# Patient Record
Sex: Female | Born: 1955 | Race: Asian | Hispanic: No | Marital: Married | Smoking: Never smoker
Health system: Western US, Academic
[De-identification: ages and names within clinical notes are randomized; demographics above are authoritative.]

## PROBLEM LIST (undated history)

## (undated) DIAGNOSIS — Z87440 Personal history of urinary (tract) infections: Secondary | ICD-10-CM

## (undated) DIAGNOSIS — E119 Type 2 diabetes mellitus without complications: Secondary | ICD-10-CM

## (undated) DIAGNOSIS — I10 Essential (primary) hypertension: Secondary | ICD-10-CM

## (undated) DIAGNOSIS — E7800 Pure hypercholesterolemia, unspecified: Secondary | ICD-10-CM

## (undated) HISTORY — DX: Type 2 diabetes mellitus without complications: E11.9

## (undated) HISTORY — DX: Personal history of urinary (tract) infections: Z87.440

## (undated) NOTE — Assessment & Plan Note (Signed)
Associated Problem(s): Dyspepsia  Formatting of this note might be different from the original.  Chronic, intermittent. 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 Advised smaller portions meals, low in CHO. EGD is indicated.    The procedure and prep were discussed with the patient.   Biopsies or removal of tissue will be performed if indicated. All of the patient's questions and concerns were addressed. Patient verbalized understanding and wishes to proceed with examination.     Electronically signed by Rita Ohara, MD at 11/10/2021  9:17 AM PDT

## (undated) NOTE — Progress Notes (Signed)
Formatting of this note is different from the original.  Gastroenterology Consult Note    Impression and Recommendations:   Lori Bowman is a 10 year old female who visits for evaluation of:    Problem List Items Addressed This Visit       Dyspepsia - Primary     Chronic, intermittent. 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 Advised smaller portions meals, low in CHO. EGD is indicated.    The procedure and prep were discussed with the patient.   Biopsies or removal of tissue will be performed if indicated. All of the patient's questions and concerns were addressed. Patient verbalized understanding and wishes to proceed with examination.          Relevant Orders    REFERRAL FOR PROCEDURE/SURGERY AUTH REQUEST     Dear Dr No PCP,    I had the pleasure of seeing our mutual patient, Lori Bowman, in Gastroenterology clinic today. As you are aware, Lori Bowman is a 76 year old female who was referred for evaluation of chronic dyspepsia.    > 1 year of intermittent epigastric discomfort with or without po intake. Her bloating did not change with reducing CHO intake. + nausea, vomiting when symptoms are severe. No weight loss, hematemesis. No asa or nsaids use. No family history of foregut pathology or malignancies.      Medical History         No past medical history on file.         No past surgical history on file.    Current Outpatient Medications   Medication Sig Dispense Refill    amLODIPine (NORVASC) 5mg  Tab Take one Tab by mouth every morning      atorvastatin (LIPITOR) 10mg  Tab Take one Tab by mouth daily      cetirizine (ZYRTEC) 10mg  Tab Take one Tab by mouth daily      lisinopril (PRINIVIL, ZESTRIL) 10mg  Tab Take one Tab by mouth daily      metFORMIN (GLUCOPHAGE) 500mg  Tab Take 1 tablet by mouth once daily with a meal.      metoprolol succinate (KAPSPARGO SPRINKLE) 50mg  Capsule Take one Cap by mouth daily      phentermine (ADIPEX P) 37.5mg  Cap Take one Cap by mouth daily 30 minutes before breakfast       No current  facility-administered medications for this visit.     Review of patient's allergies indicates no known allergies.    FH: family history is not on file.    Social History     Tobacco Use    Smoking status: Never    Smokeless tobacco: Not on file   Vaping Use    Vaping Use: Not on file   Substance Use Topics    Alcohol use: Never    Drug use: Not on file       Physical exam:  BP 107/70 (Site: LA, Position: Sitting)   Pulse 70   Wt 64.8 kg (142 lb 14.4 oz)   BMI There is no height on file.    Gen: NAD  Eyes: Normal conjunctiva, PERRL    Thank you very much for this consultation. Should you have any questions, please feel free to contact me.    Sincerely,    Waynetta Pean, MD  Gastroenterology    Electronically signed by Rita Ohara, MD at 11/10/2021  9:18 AM PDT

---

## 1999-04-11 ENCOUNTER — Other Ambulatory Visit: Admission: RE | Admit: 1999-04-11 | Discharge: 1999-04-11 | Payer: Self-pay | Admitting: Obstetrics and Gynecology

## 2008-06-22 ENCOUNTER — Emergency Department (HOSPITAL_BASED_OUTPATIENT_CLINIC_OR_DEPARTMENT_OTHER): Admission: EM | Admit: 2008-06-22 | Discharge: 2008-06-22 | Payer: Self-pay | Admitting: Emergency Medicine

## 2008-06-22 ENCOUNTER — Ambulatory Visit: Payer: Self-pay | Admitting: Diagnostic Radiology

## 2009-12-03 IMAGING — CT CT HEAD W/O CM
2 series · 16 of 30 positions shown, 18 images · non-contrast
Comparison: None

CLINICAL DATA: Head trauma, struck behind left ear, laceration,
fall

CT HEAD WITHOUT CONTRAST
TECHNIQUE: Contiguous axial images were obtained from the base of
the skull through the vertex without contrast.

[Series 2: head 4.8 h37s · axial · 0.44mm/px · z∈[-130,+6]mm · 8 of 36 slices shown, 10 images]
[im 4/36  brain]
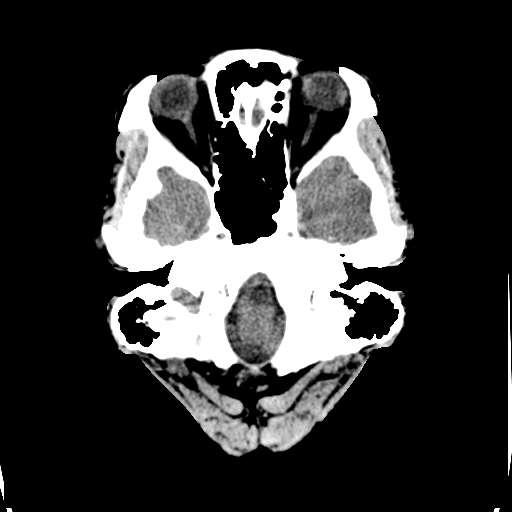
[im 4/36  bone]
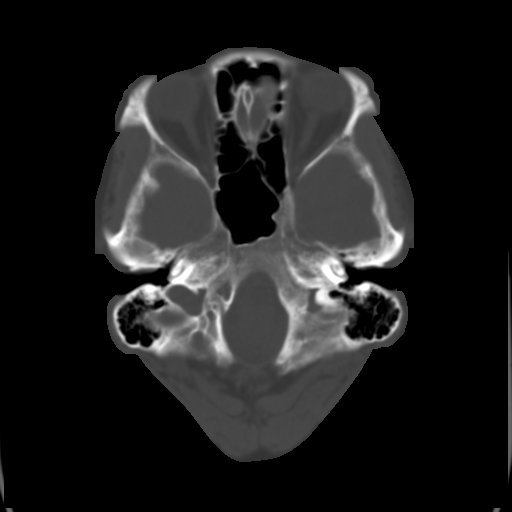
[im 8/36  brain]
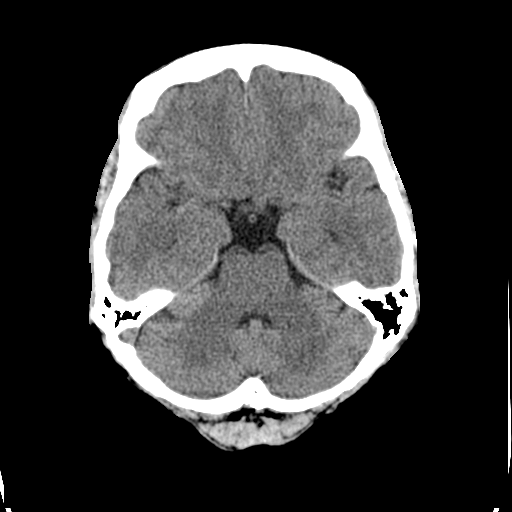
[im 12/36  brain]
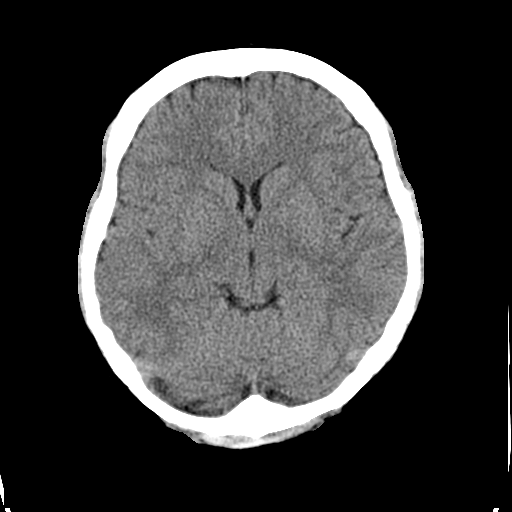
[im 16/36  brain]
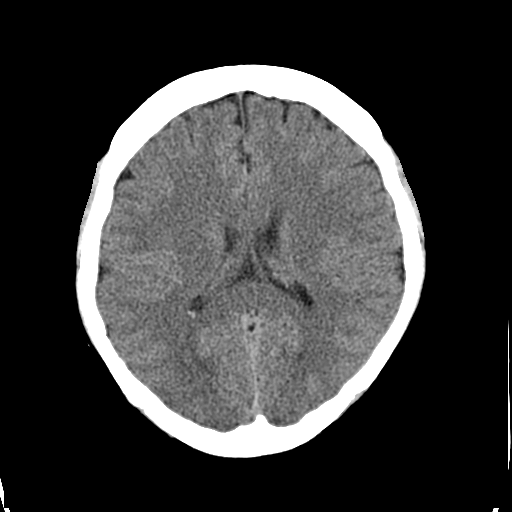
[im 20/36  brain]
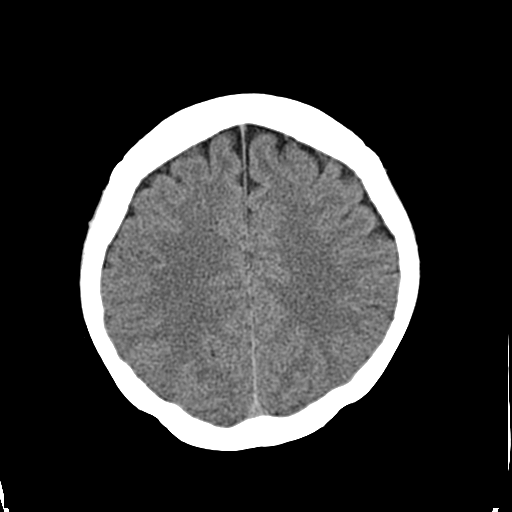
[im 20/36  bone]
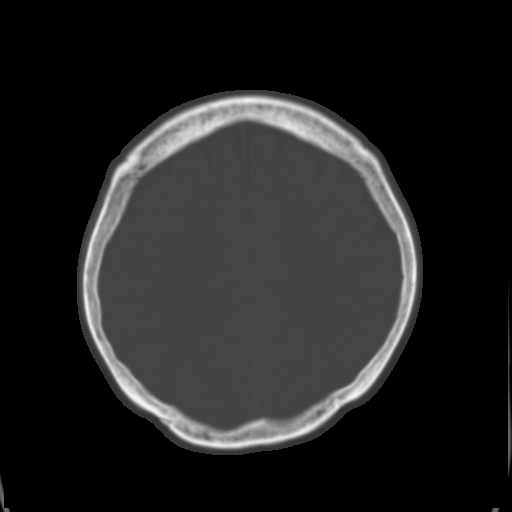
[im 24/36  brain]
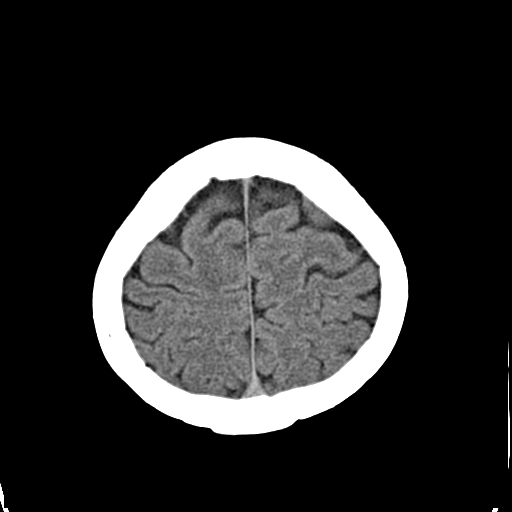
[im 28/36  brain]
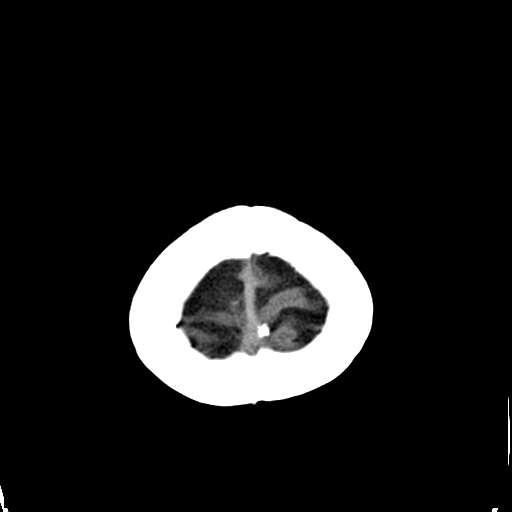
[im 32/36  brain]
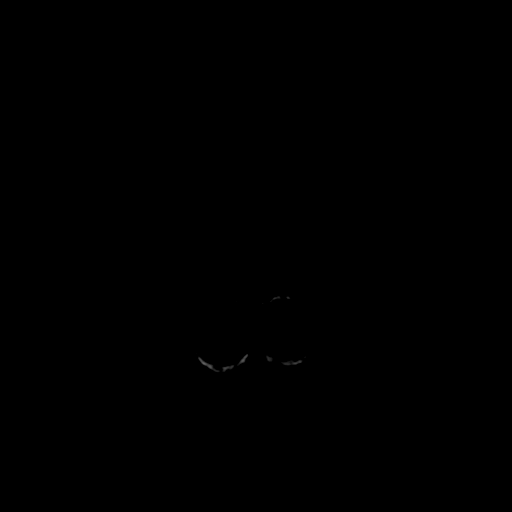

[Series 3: head 2.4 h60s bone · axial · 0.44mm/px · z∈[-129,+8]mm · 8 of 72 slices shown]
[im 8/72  bone]
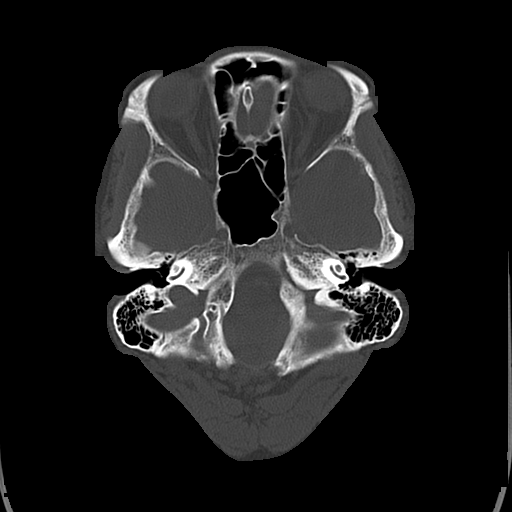
[im 15/72  bone]
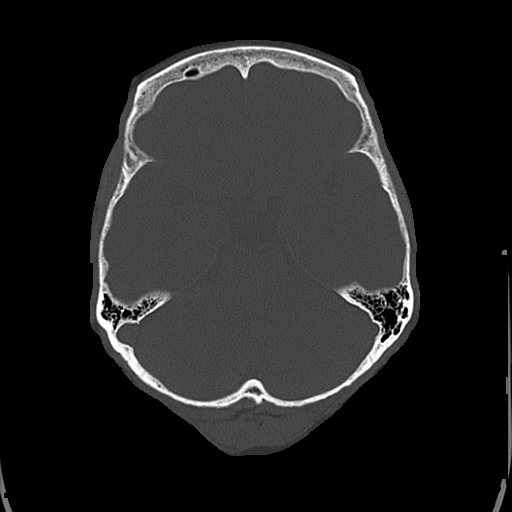
[im 23/72  bone]
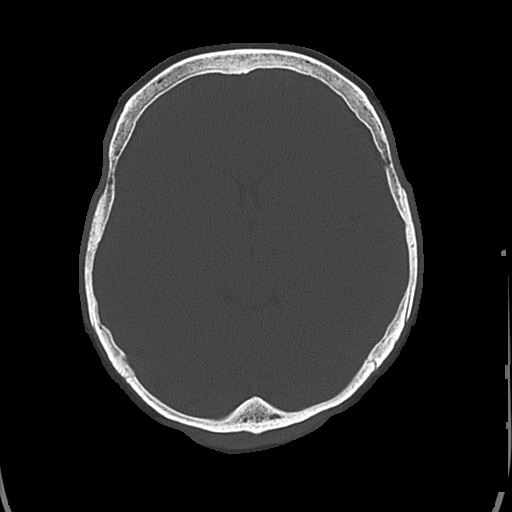
[im 30/72  bone]
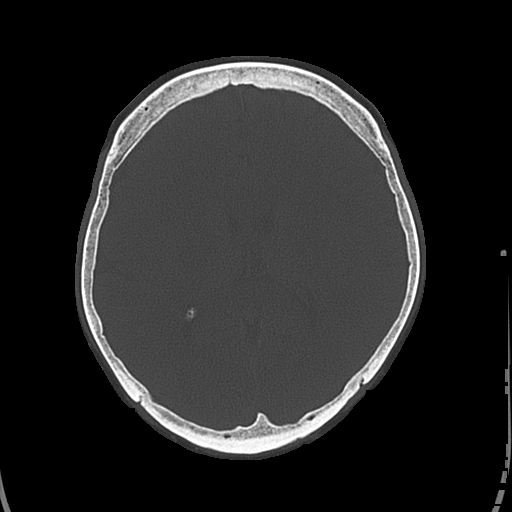
[im 42/72  bone]
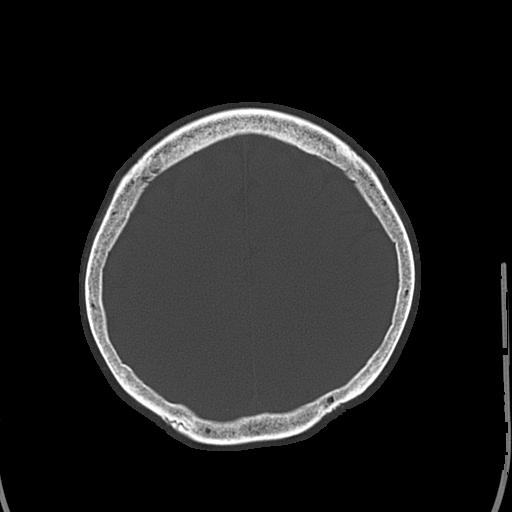
[im 49/72  bone]
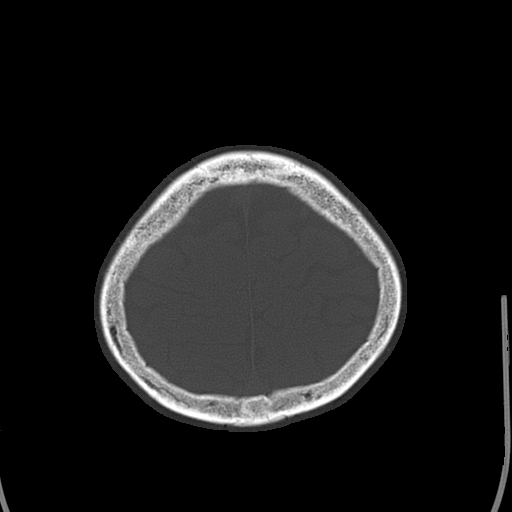
[im 57/72  bone]
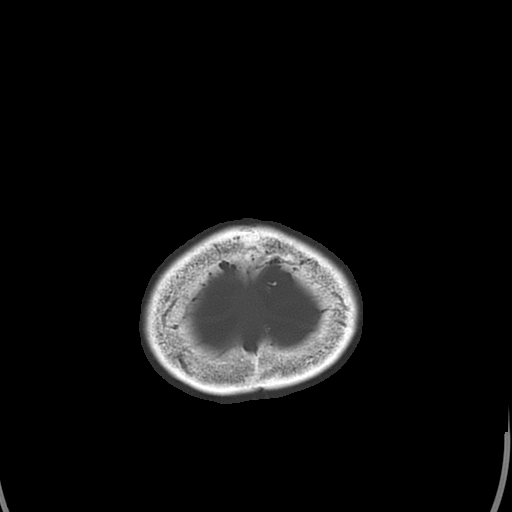
[im 64/72  bone]
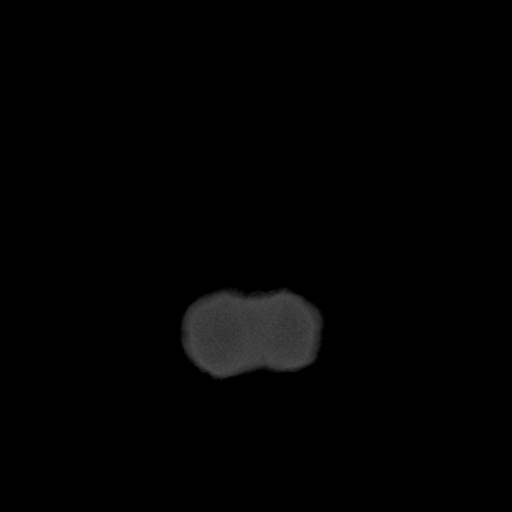

[16 of 30 positions shown; findings below may reference images not displayed]

FINDINGS: Normal ventricular morphology.
No midline shift or mass effect.
Normal appearance of brain parenchyma.
No intracranial hemorrhage, mass lesion, or acute infarction.
Visualized paranasal sinuses and mastoid air cells clear.
Bones unremarkable.
IMPRESSION: No acute intracranial abnormalities.

## 2016-04-03 ENCOUNTER — Other Ambulatory Visit: Payer: Self-pay | Admitting: Podiatry

## 2016-04-03 MED ORDER — OXYCODONE-ACETAMINOPHEN 5-325 MG PO TABS: 1.0000 | ORAL_TABLET | Freq: Four times a day (QID) | ORAL | 0 refills | Status: DC | PRN

## 2016-04-03 MED ORDER — NAPROXEN 500 MG PO TABS: 500.0000 mg | ORAL_TABLET | Freq: Two times a day (BID) | ORAL | 0 refills | Status: AC

## 2016-04-11 ENCOUNTER — Encounter: Payer: Self-pay | Admitting: Podiatry

## 2016-04-11 ENCOUNTER — Ambulatory Visit (INDEPENDENT_AMBULATORY_CARE_PROVIDER_SITE_OTHER): Payer: BLUE CROSS/BLUE SHIELD | Admitting: Podiatry

## 2016-04-11 DIAGNOSIS — S99922A Unspecified injury of left foot, initial encounter: Secondary | ICD-10-CM | POA: Diagnosis not present

## 2016-04-11 DIAGNOSIS — S92309A Fracture of unspecified metatarsal bone(s), unspecified foot, initial encounter for closed fracture: Secondary | ICD-10-CM

## 2016-04-11 NOTE — Patient Instructions (Signed)
Left foot and leg placed in Fiber glass cast. Return in 3 weeks.

## 2016-04-11 NOTE — Progress Notes (Signed)
SUBJECTIVE: 61 y.o. year old female presents for follow up on injured left foot. She fell down on stairs. Foot was too painful to bear any weight. She was seen last week during off office hour for urgent foot pain. The foot was placed in Unaboot to reduce swelling and confirmed of metatarsal base fractures on 2, 3, 4th left foot. She is in to have cast put on.   REVIEW OF SYSTEMS: Pertinent items noted in HPI and remainder of comprehensive ROS otherwise negative.  OBJECTIVE: DERMATOLOGIC EXAMINATION: Forefoot ecchymosis left foot with reduced swelling.   VASCULAR EXAMINATION OF LOWER LIMBS: All pedal pulses are palpable with normal pulsation.  Capillary Filling times within 3 seconds in all digits.  Reduced edema on left foot. Positive of forefoot ecchymosis. Temperature gradient from tibial crest to dorsum of foot is within normal bilateral.  NEUROLOGIC EXAMINATION OF THE LOWER LIMBS: All epicritic and tactile sensations grossly intact.  MUSCULOSKELETAL EXAMINATION: No gross deformities.  RADIOGRAPHIC STUDIES:  AP View:  First intermetatarsal angle: Positive of compound fracture extra articular at the base of 2nd 3rd and 4th left foot without displacement of fracture particles.   ASSESSMENT: 1. Injury left foot. 2. Compound fracture left 2nd, 3rd, 4th metatarsal base extra articular.  PLAN: Reviewed findings and available treatment options. Left foot and leg placed in Fiberglass cast.

## 2016-04-27 ENCOUNTER — Other Ambulatory Visit: Payer: Self-pay | Admitting: Internal Medicine

## 2016-04-27 DIAGNOSIS — R3129 Other microscopic hematuria: Secondary | ICD-10-CM

## 2016-05-02 ENCOUNTER — Ambulatory Visit (INDEPENDENT_AMBULATORY_CARE_PROVIDER_SITE_OTHER): Payer: BLUE CROSS/BLUE SHIELD | Admitting: Podiatry

## 2016-05-02 DIAGNOSIS — S92309A Fracture of unspecified metatarsal bone(s), unspecified foot, initial encounter for closed fracture: Secondary | ICD-10-CM

## 2016-05-02 MED ORDER — OXYCODONE-ACETAMINOPHEN 5-325 MG PO TABS: 1.0000 | ORAL_TABLET | Freq: Four times a day (QID) | ORAL | 0 refills | Status: AC | PRN

## 2016-05-03 ENCOUNTER — Encounter: Payer: Self-pay | Admitting: Podiatry

## 2016-05-03 NOTE — Patient Instructions (Signed)
Positive of bone healing in progress at fracture site left foot.

## 2016-05-03 NOTE — Progress Notes (Signed)
SUBJECTIVE: 61 y.o. year old female presents wearing Fiberglass cast and using Crutches for follow up on injured left foot.   HPI:  4 weeks ago s+he fell down on stairs. Foot was too painful to bear any weight. She was seen last week during off office hour for urgent foot pain. The foot was placed in Unaboot to reduce swelling and confirmed of metatarsal base fractures on 2, 3, 4th left foot. She is in to have cast put on.   REVIEW OF SYSTEMS: Pertinent items noted in HPI and remainder of comprehensive ROS otherwise negative.  OBJECTIVE: DERMATOLOGIC EXAMINATION: Forefoot ecchymosis left foot with reduced swelling.   VASCULAR EXAMINATION OF LOWER LIMBS: All pedal pulses are palpable with normal pulsation.  No edema or erythema noted. Temperature gradient from tibial crest to dorsum of foot is within normal bilateral.  NEUROLOGIC EXAMINATION OF THE LOWER LIMBS: All epicritic and tactile sensations grossly intact.  MUSCULOSKELETAL EXAMINATION: No gross deformities.  RADIOGRAPHIC STUDIES:  AP View:  Positive of healing fracture site with trabeculation crossing and filling fracture lines at the proximal metatarsal shaft of 2nd, 3rd, and 4th left foot..  ASSESSMENT: 1. Injury left foot with compound fracture left 2nd, 3rd, 4th metatarsal base extra articular. 2. Positive sign of bone healing.   PLAN: Reviewed findings. Cast removed and placed in CAM walker.  Return in 4 weeks.

## 2016-05-30 ENCOUNTER — Encounter: Payer: Self-pay | Admitting: Podiatry

## 2016-05-30 ENCOUNTER — Ambulatory Visit (INDEPENDENT_AMBULATORY_CARE_PROVIDER_SITE_OTHER): Payer: BLUE CROSS/BLUE SHIELD | Admitting: Podiatry

## 2016-05-30 DIAGNOSIS — S92309A Fracture of unspecified metatarsal bone(s), unspecified foot, initial encounter for closed fracture: Secondary | ICD-10-CM

## 2016-05-30 NOTE — Progress Notes (Signed)
SUBJECTIVE: 61 y.o.year old femalepresents wearing Fiberglass cast and using Crutches for follow up on injured left foot.  Was able to walk on barefoot at home without pain.  HPI: S/P 8 weeks ago she fell down on stairs.  She was on cast for 4 weeks and been on Cam walker for 4 weeks.  OBJECTIVE: DERMATOLOGIC EXAMINATION: No abnormal color or swelling.  VASCULAR EXAMINATION OF LOWER LIMBS: All pedal pulses are palpable with normal pulsation.  No edema or erythema noted.  NEUROLOGIC EXAMINATION OF THE LOWER LIMBS: All epicritic and tactile sensations grossly intact.  MUSCULOSKELETAL EXAMINATION: Good range of motion without pain on forefoot and rearfoot in affected left lower limb.  PLAN: Reviewed findings. Ok to resume regular activity with regular shoes.

## 2016-05-30 NOTE — Patient Instructions (Signed)
8 weeks following left foot injury. Ok to wear regular shoes. Return as needed.

## 2016-07-17 ENCOUNTER — Other Ambulatory Visit: Payer: Self-pay | Admitting: Podiatry

## 2016-07-17 MED ORDER — SULFAMETHOXAZOLE-TRIMETHOPRIM 800-160 MG PO TABS: 1.0000 | ORAL_TABLET | Freq: Two times a day (BID) | ORAL | 0 refills | Status: AC

## 2016-09-28 ENCOUNTER — Encounter: Payer: Self-pay | Admitting: Podiatry

## 2019-12-21 ENCOUNTER — Emergency Department
Admission: RE | Admit: 2019-12-21 | Discharge: 2019-12-21 | Disposition: A | Discharge status: Home / Self Care | Payer: No Typology Code available for payment source | Attending: Emergency Medicine | Admitting: Emergency Medicine

## 2019-12-21 DIAGNOSIS — I10 Essential (primary) hypertension: Secondary | ICD-10-CM | POA: Insufficient documentation

## 2019-12-21 DIAGNOSIS — R319 Hematuria, unspecified: Secondary | ICD-10-CM | POA: Insufficient documentation

## 2019-12-21 DIAGNOSIS — N390 Urinary tract infection, site not specified: Secondary | ICD-10-CM | POA: Insufficient documentation

## 2019-12-21 HISTORY — DX: Pure hypercholesterolemia, unspecified: E78.00

## 2019-12-21 HISTORY — DX: Essential (primary) hypertension: I10

## 2019-12-21 LAB — URINALYSIS-COMPLETE
Bilirubin: NEGATIVE
Glucose: NEGATIVE mg/dL
Ketones: NEGATIVE
Nitrite: POSITIVE — AB
Protein: 100 mg/dL — AB
RBC: >182 /HPF — ABNORMAL HIGH (ref <=3)
Specific Gravity: 1.016 (ref 1.002–1.030)
Squamous Epithelial Cells: <1 /HPF
Urobilinogen: 4 mg/dL — AB
WBC: 140 /HPF — ABNORMAL HIGH (ref <=5)
pH Urine: 6 (ref 5.0–9.0)

## 2019-12-21 MED ORDER — CEPHALEXIN 500 MG CAPSULE
500.00 mg | ORAL_CAPSULE | Freq: Three times a day (TID) | ORAL | 0 refills | Status: AC
Start: 2019-12-21 — End: 2019-12-28

## 2019-12-21 MED ORDER — CEPHALEXIN 500 MG CAPSULE
500.00 mg | ORAL_CAPSULE | Freq: Once | ORAL | Status: AC
Start: 2019-12-21 — End: 2019-12-21
  Administered 2019-12-21: 500 mg via ORAL
  Filled 2019-12-21: qty 1

## 2019-12-21 MED ORDER — PHENAZOPYRIDINE 100 MG TABLET
100.00 mg | ORAL_TABLET | Freq: Three times a day (TID) | ORAL | 0 refills | Status: AC | PRN
Start: 2019-12-21 — End: 2019-12-23

## 2019-12-21 MED ORDER — PHENAZOPYRIDINE 100 MG TABLET
100.00 mg | ORAL_TABLET | Freq: Once | ORAL | Status: AC
Start: 2019-12-21 — End: 2019-12-21
  Administered 2019-12-21: 100 mg via ORAL
  Filled 2019-12-21: qty 1

## 2019-12-21 NOTE — ED Triage Note (Signed)
UTI s/s that started last night.

## 2019-12-21 NOTE — ED Provider Notes (Signed)
DOS: 12/21/2019         Primary Care Provider: Scheryl Darter, MD    Chief Complaint   Patient presents with    Urinary Tract Infection     The history provided by the patient.    Patient is 44yr female, with a past medical history significant for hypertension high cholesterol no other significant medical problem, who presents to the ED with a chief complaint of UTI symptoms started yesterday morning dysuria urgency frequency noted some blood in the urine today denies nausea vomiting fevers chills or flank pain. History of similar about 15 years ago.    Symptom onset: About 1 day  Location: The suprapubic area  Severity: 6/10  Progression of symptoms: Unchanged and slightly worsened  Exacerbating or relieving factors: None none stated    Past Medical History:   Diagnosis Date    HTN (hypertension)     Hypercholesterolemia        No past surgical history on file.    Social History     Tobacco Use    Smoking status: Never Smoker    Smokeless tobacco: Never Used   Substance Use Topics    Alcohol use: Yes       Physical Exam  Vitals and nursing note reviewed.   Constitutional:       General: She is not in acute distress.     Appearance: She is well-developed. She is not ill-appearing or toxic-appearing.   HENT:      Head: Normocephalic and atraumatic.   Eyes:      Pupils: Pupils are equal, round, and reactive to light.   Cardiovascular:      Rate and Rhythm: Regular rhythm. Tachycardia present.   Pulmonary:      Effort: Pulmonary effort is normal.      Breath sounds: Normal breath sounds.   Abdominal:      General: There is no distension.      Palpations: Abdomen is soft.   Musculoskeletal:         General: No swelling. Normal range of motion.      Cervical back: Normal range of motion and neck supple.   Skin:     General: Skin is warm and dry.   Neurological:      General: No focal deficit present.      Mental Status: She is alert and oriented to person, place, and time. Mental status is at baseline.      Cranial  Nerves: No cranial nerve deficit.   Psychiatric:         Mood and Affect: Mood normal.         Review of Systems  Positives and pertinent negatives as per HPI. All other systems were reviewed and are negative.     ED Triage Vitals   Enc Vitals Group      BP 12/21/19 1900 (!) 166/98      Pulse 12/21/19 1900 112      Resp 12/21/19 1900 18      Temp 12/21/19 1900 37.1 C (98.8 F)      Temp src 12/21/19 1900 Temporal      SpO2 12/21/19 2000 95 %      Weight 12/21/19 1900 65.3 kg (144 lb)      Height 12/21/19 1900 1.651 m (5\' 5" )      Head Circumference --       Peak Flow --       Pain Score --  Pain Loc --       Pain Edu? --       Excl. in GC? --        INITIAL ASSESSMENT & PLAN, MEDICAL DECISION MAKING, ED COURSE  Patient is a 48yr female who present to the ED with UTI symptoms.     Medical Decision Making:     Differentials on this patient include but are not limited too UTI cystitis doubt kidney stone or pyelonephritis    Discussion: Patient looks well no distress slightly tachycardic may be due to a little bit of anxiety      Work up in ED initiated with: Urinalysis    Initial interventions: Antibiotics improved      Results of Testing/ED Course    Results for orders placed or performed during the hospital encounter of 12/21/19   Urinalysis-Complete     Status: Abnormal   Result Value Status    Color Amber (Abnl) Final    Clarity Hazy (Abnl) Final    Specific Gravity  1.016 Final    pH Urine 6.0 Final    Protein 100 (Abnl) Final    Glucose Negative Final    Ketones Negative Final    Bilirubin Negative Final    Urobilinogen 4.0 (Abnl) Final    Occult Blood  Large (Abnl) Final    Leukocyte Esterase Moderate (Abnl) Final    Nitrite Positive (Abnl) Final    RBC >182 (H) Final    WBC 140 (H) Final    Bacteria Few Final    Squamous Epithelial Cells <1 Final    Mucous Few Final    Hyaline Casts Few Final       Medications   Phenazopyridine (PYRIDIUM) Tablet 100 mg (has no administration in time range)   Cephalexin  (KEFLEX) Capsule 500 mg (500 mg ORAL Given 12/21/19 2021)   Phenazopyridine (PYRIDIUM) Tablet 100 mg (100 mg ORAL Given 12/21/19 2021)       None        Chart Review: Patient's prior medical records reviewed from none available    Clinical Patient Summary: 20:43  Patient pretty significant UTI with hematuria she was treated antibiotics here no cultures available on file did give her Keflex and Pyridium. Slight tachycardia noted only 103 very minimal nature. Otherwise low risk for other high risk features with the patient    LATEST VITAL SIGNS  Temp: 37.1 C (98.8 F) (10/03 1900)  Temp src: Temporal (10/03 1900)  Pulse: 103 (10/03 2037)  BP: 146/105 (10/03 2037)  Resp: 20 (10/03 2037)  SpO2: 93 % (10/03 2037)  Height: 165.1 cm (5\' 5" ) (10/03 1900)  Weight: 65.3 kg (144 lb) (10/03 1900)       Clinical Impression:     ICD-10-CM    1. Urinary tract infection with hematuria, site unspecified  N39.0     R31.9      Second        Disposition:  Home                           Condition stable    Electronically signed by [id number], MD

## 2019-12-21 NOTE — Discharge Instructions (Signed)
Take medicines as prescribed drink plenty of fluids.Lori Bowman

## 2019-12-23 LAB — CULTURE URINE, BACTI: Urine Culture: >100000 — AB

## 2020-02-01 ENCOUNTER — Emergency Department
Admission: AD | Admit: 2020-02-01 | Discharge: 2020-02-01 | Disposition: A | Discharge status: Home / Self Care | Payer: No Typology Code available for payment source | Attending: PHYSICIAN ASSISTANT | Admitting: PHYSICIAN ASSISTANT

## 2020-02-01 DIAGNOSIS — N3091 Cystitis, unspecified with hematuria: Secondary | ICD-10-CM | POA: Insufficient documentation

## 2020-02-01 LAB — URINALYSIS-COMPLETE
Bilirubin: NEGATIVE
Glucose: NEGATIVE mg/dL
Ketones: NEGATIVE
Nitrite: NEGATIVE
Non-Squamous Epithelial Cells: 3 /HPF — ABNORMAL HIGH (ref <=0)
Protein: 30 mg/dL — AB
RBC: 110 /HPF — ABNORMAL HIGH (ref <=3)
Specific Gravity: 1.013 (ref 1.002–1.030)
Urobilinogen: NEGATIVE mg/dL
WBC: >182 /HPF — ABNORMAL HIGH (ref <=5)
pH Urine: 8 (ref 5.0–9.0)

## 2020-02-01 MED ORDER — CEPHALEXIN 500 MG CAPSULE
500.00 mg | ORAL_CAPSULE | Freq: Two times a day (BID) | ORAL | 0 refills | Status: AC
Start: 2020-02-01 — End: 2020-02-15

## 2020-02-01 NOTE — ED Nursing Note (Signed)
Pt provided warm blanket upon request.

## 2020-02-01 NOTE — ED Nursing Note (Signed)
PA Norton at bedside.

## 2020-02-01 NOTE — ED Triage Note (Signed)
Pt reports she feels like she has to urinate every 10 minutes, feels like a squeeze when she is urinating and burning started this morning

## 2020-02-01 NOTE — Discharge Instructions (Signed)
Drink at least 12 glasses of water every day, take antibiotic for the duration it is prescribed, recommend eating smaller and more frequent meals to prevent nausea.    Recommend starting probiotic supplement today and continuing daily probiotic for at least 2 weeks after last dose of antibiotic is taken.    Use warm water or warm compresses for discomfort, Motrin (Ibuprofen) or Tylenol (Acetaminophen) may also help.     Urine will be studied to determine bacterial susceptibility to Keflex (cephalexin). If you hear nothing from hospital or provider, this means that Keflex was the appropriate treatment for the bacteria. If bacteria is resistant to this antibiotic, someone will call you and may prescribe a different treatment.    Follow-up with your Primary Care Provider and ask for a referral to a Urologist.

## 2020-02-01 NOTE — ED Nursing Note (Signed)
DISCHARGE NOTE:    Written and verbal education provided the Patient who verbalized understanding.  All questions and concerns addressed.  Pt instructed to to follow up with PMD and/or return to ED for worsening s/s - Patient verbalized understanding.  Patient discharged to Home.  All belongings sent with patient at time of discharge.  Patient leaves the ED ambulatory with self.    Patient's pain/comfort level assessed during their ED course.  All medications and/or non-pharmacological interventions for patient's increased comfort implemented as needed.

## 2020-02-01 NOTE — ED Nursing Note (Signed)
Attempted to call pt into triage, pt is in the bathroom

## 2020-02-03 LAB — CULTURE URINE, BACTI: Urine Culture: >100000 — AB

## 2020-02-08 NOTE — ED Provider Notes (Signed)
Chief Complaint   Patient presents with    Urinary Tract Infection       HPI: Lori Bowman is a 64yr female who presents with urinary symptoms X 2 days.    Dysuria:   Yes  U-Urgency:   Yes  U-Freq:  Yes    Fever:  No  Naus/Vom: No  Abdom. Pain: No  Back/Flank pain: No    Palpitations: No  Lightheaded: No  Syncope:  No      Patient says that she was treated for a UTI when she last came to the emergency room in October.  Patient says antibiotics did help her feel better but urinary symptoms did return.  Patient denies significant history of previous UTIs.      Quality: Burning  Location: Urethral meatus  Severity: 0-5/10  Provoking Factors: Urinating  Relieving Factors: Rest    Past Medical History:   Diagnosis Date    HTN (hypertension)     Hypercholesterolemia        No past surgical history on file.    No family history on file.    Social History     Socioeconomic History    Marital status: UNKNOWN     Spouse name: Not on file    Number of children: Not on file    Years of education: Not on file    Highest education level: Not on file   Occupational History    Not on file   Tobacco Use    Smoking status: Never Smoker    Smokeless tobacco: Never Used   Substance and Sexual Activity    Alcohol use: Yes    Drug use: Never    Sexual activity: Not on file   Other Topics Concern    Not on file   Social History Narrative    Not on file     Social Determinants of Health     Financial Resource Strain: Not on file   Food Insecurity: Not on file   Transportation Needs: Not on file   Physical Activity: Not on file   Stress: Not on file   Social Connections: Not on file   Intimate Partner Violence: Not on file   Housing Stability: Not on file       Allergies: No Known Allergies    Medication: No current facility-administered medications for this encounter.    Current Outpatient Medications:     Amlodipine (NORVASC) 5 mg Tablet, Take 5 mg by  mouth every morning., Disp: , Rfl:     CALCIUM PO, Take 1 tablet by mouth every morning., Disp: , Rfl:     Cephalexin (KEFLEX) 500 mg Capsule, Take 1 capsule by mouth 2 times daily for 14 days., Disp: 28 capsule, Rfl: 0    cranberry conc/ascorbic acid (CRANBERRY PLUS VITAMIN C PO), Take 2 tablets by mouth 3 times daily., Disp: , Rfl:     docosahexaenoic acid/epa (FISH OIL PO), Take 1 capsule by mouth every morning., Disp: , Rfl:     Ezetimibe (ZETIA) 10 mg Tablet, Take 10 mg by mouth every morning., Disp: , Rfl:     Hydrochlorothiazide (HYDRO-DIURIL) 25 mg Tablet, Take 25 mg by mouth every morning., Disp: , Rfl:     metoprolol succinate 50 mg ER 24 hr Sprinkle Capsule, Take 50 mg by mouth every morning., Disp: , Rfl:     Phentermine (ADIPEX-P) 37.5 mg Capsule, Take 37.5 mg by mouth every morning before a meal., Disp: , Rfl:  ROS:  Review of Systems  Review of Systems   Constitutional: Negative for activity change, chills, fatigue and fever.   HENT: Negative.    Eyes: Negative.    Respiratory: Negative.    Cardiovascular: Negative.    Gastrointestinal: Negative for abdominal pain, diarrhea, nausea and vomiting.   Genitourinary: Positive for dysuria, frequency and urgency. Negative for difficulty urinating, dyspareunia, flank pain, hematuria, menstrual problem, pelvic pain, vaginal bleeding and vaginal discharge.   Musculoskeletal: Negative.    Skin: Negative.    Neurological: Negative for dizziness, syncope, weakness, light-headedness, numbness and headaches.   Psychiatric/Behavioral: Negative.      PE:  Physical Exam  Constitutional: She is alert and in no acute distress. She appears well-developed and well-nourished.   HENT:   Head: Normocephalic and atraumatic.   Eyes: Pupils are equal, round, and reactive to light.   Neck: Normal range of motion.    Pulmonary/Chest: Effort normal and equal chest expansion   Abdominal: Soft and nontender all quadrants, no CVAT.  Musculoskeletal: Normal range of motion.   Neurological: She is alert. Normal speech. No gross motor or sensory deficits.   Skin: Skin is warm and dry. No visible rash or bruising.  Psychiatric: She has a normal mood and affect. Her behavior is normal.     Temp: 36.8 C (98.2 F) (11/14 1206)  Temp src: Temporal (11/14 1206)  Pulse: 95 (11/14 1217)  BP: 128/84 (11/14 1217)  Resp: 18 (11/14 1217)  SpO2: 98 % (11/14 1217)  Height: 165.1 cm (5\' 5" ) (11/14 1206)  Weight: 64.4 kg (142 lb) (11/14 1206)    Differentials: Sepsis, Pyelonephritis, Cystitis, Bladder Spasm, Renal Colic, STI    Labs:   Results for orders placed or performed during the hospital encounter of 02/01/20   Urinalysis-Complete     Status: Abnormal   Result Value Status    Color Yellow Final    Clarity Cloudy (Abnl) Final    Specific Gravity  1.013 Final    pH Urine 8.0 Final    Protein 30 (Abnl) Final    Glucose Negative Final    Ketones Negative Final    Bilirubin Negative Final    Urobilinogen Negative Final    Occult Blood  Moderate (Abnl) Final    Leukocyte Esterase Large (Abnl) Final    Nitrite Negative Final    RBC 110 (H) Final    WBC >182 (H) Final    Non-Squamous Epithelial Cells 3 (H) Final   Culture Urine, Bacti     Status: Abnormal    Specimen: CLEAN CATCH; URINE   Result Value Status    Urine Culture >100,000 CFU/mL Escherichia coli (Abnl) Final       Susceptibility    Escherichia coli - MIC, MICRODILUTION     AMPICILLIN >=32 RESISTANT ug/ml     AMPICIL/SULBACT 16 INTERMEDIATE ug/ml     CEFAZOLIN <=4 SUSCEPTIBLE ug/ml     CEFEPIME <=0.12 SUSCEPTIBLE ug/ml     CEFTAZIDIME <=1 SUSCEPTIBLE ug/ml     CEFTRIAXONE <=0.25 SUSCEPTIBLE ug/ml     CIPROFLOXACIN <=0.25 SUSCEPTIBLE ug/ml     ERTAPENEM <=0.12 SUSCEPTIBLE ug/ml     ~EXTENDED SPECTRUM BETA-LACTAMASE  (ESBL) Negative  ug/ml     GENTAMICIN <=1 SUSCEPTIBLE ug/ml     IMIPENEM <=0.25 SUSCEPTIBLE ug/ml     LEVOFLOXACIN <=0.12 SUSCEPTIBLE ug/ml     NITROFURANTOIN <=16 SUSCEPTIBLE ug/ml     PIPERACIL/TAZOB <=4 SUSCEPTIBLE ug/ml     TOBRAMYCIN <=1 SUSCEPTIBLE ug/ml  TRIMETH/SULFA >=320 RESISTANT ug/ml       Radiology: No results found.    Orders Placed This Encounter    Urinalysis-Complete    Cephalexin (KEFLEX) 500 mg Capsule       Coding    ED Summary & MDM: 47yr female p/w urinary symptoms x 2 days. Physical exam reveals non-toxic appearing female with abdomen soft nontender, no CVAT.  Urinalysis reveals over 182 WBCs, 110 RBCs, large leuk esterase.  Urine culture from 01/17/2020 was reviewed and cephalexin was appropriate treatment however do believe a longer course was likely indicated.  Will prescribe 14 days of Keflex to patient.  I did discuss signs and symptoms of acute worsening to the patient and encouraged her to return here if she experiences these.  I also discussed with her indications for urology referral and she will keep this in mind when following up with PCP.    Tachycardia of unclear etiology, unlikely from infection or acute injury, possibly secondary to anxiety.  Last visit on 01/17/2020 patient also had a little bit of tachycardia thought to be possibly secondary to anxiety.    Prescribed Keflex to patient's pharmacy.  Advised copious hydration, NSAIDs for pain relief, take antibiotic for duration prescribed.    Pt's questions illicited and answered, counseled on red flags which would warrant immediate return to ED, pt understands findings and directions, pt agrees with plan to follow-up with PCP.    Procedures: None    Clinical Impression:     ICD-10-CM    1. Cystitis with hematuria  N30.91        PLAN: Copious hydration, fever monitoring, NSAIDs for fever or discomfort, take antibiotics, follow up with PCP. The patient participated in the decision making process, verbalized understanding,  and no barriers to learning identified.    The following medications were prescribed: Keflex for 2 weeks    Disposition: Discharge. Follow up with PCP. ED discharge instructions were reviewed and provided.    APP DOCUMENTATION:   I have evaluated this patient, Dr. Dennison Nancy was available for consultation.       PATIENT'S GENERAL CONDITION:  Good: Vital signs are stable and within normal limits. Patient is conscious and comfortable. Indicators are excellent.     Though I suspect no acutely life threatening condition, I did recommend patient return for any new or worse problems. Pt was advised to follow up with PCP for further evaluation as soon as reasonable. Patient is aware their evaluation was not complete and primarily acute issues were evaluated and there is still a possibility of chronic issues requiring further evaluation.       Electronically signed by C. [place] Angelino Rumery PA-C

## 2020-02-25 ENCOUNTER — Ambulatory Visit: Payer: No Typology Code available for payment source | Attending: Family Medicine

## 2020-02-25 DIAGNOSIS — I10 Essential (primary) hypertension: Secondary | ICD-10-CM | POA: Insufficient documentation

## 2020-02-25 DIAGNOSIS — E785 Hyperlipidemia, unspecified: Secondary | ICD-10-CM

## 2020-02-25 DIAGNOSIS — E559 Vitamin D deficiency, unspecified: Secondary | ICD-10-CM | POA: Insufficient documentation

## 2020-02-25 LAB — COMPREHENSIVE METABOLIC PANEL
Adjusted Calcium: 9.2 mg/dL (ref 8.7–10.2)
Alanine Transferase (ALT): 30 U/L (ref 4–56)
Alb/Glob Ratio: 0.8 — ABNORMAL LOW (ref 1.0–1.6)
Albumin: 3.6 g/dL (ref 3.2–4.7)
Alkaline Phosphatase (ALP): 48 U/L (ref 38–126)
Aspartate Transaminase (AST): 31 U/L (ref 9–44)
BUN/ Creatinine: 31.4 — ABNORMAL HIGH (ref 7.3–21.7)
Bilirubin Total: 0.7 mg/dL (ref 0.1–2.2)
Calcium: 8.9 mg/dL (ref 8.7–10.2)
Carbon Dioxide Total: 24 mmol/L (ref 22–32)
Chloride: 104 mmol/L (ref 99–109)
Creatinine Serum: 0.7 mg/dL (ref 0.50–1.30)
E-GFR, Non-African American: 91 mL/min/{1.73_m2} (ref >60)
E-GFR: 104 mL/min/{1.73_m2} (ref >60)
Globulin: 4.6 g/dL — ABNORMAL HIGH (ref 2.2–4.2)
Glucose: 86 mg/dL (ref 70–99)
Potassium: 4 mmol/L (ref 3.5–5.2)
Protein: 8.2 g/dL (ref 5.9–8.2)
Sodium: 136 mmol/L (ref 134–143)
Urea Nitrogen, Blood (BUN): 22 mg/dL — ABNORMAL HIGH (ref 6–21)

## 2020-02-25 LAB — CBC WITH DIFFERENTIAL
Basophils % Auto: 0.9 % (ref 0.0–1.0)
Basophils Abs Auto: 0.1 10*3/uL (ref 0.0–0.1)
Eosinophils % Auto: 4.7 % — ABNORMAL HIGH (ref 0.0–4.0)
Eosinophils Abs Auto: 0.3 10*3/uL — ABNORMAL HIGH (ref 0.0–0.2)
Hematocrit: 37.7 % (ref 36.0–48.0)
Hemoglobin: 13.3 g/dL (ref 12.0–16.0)
Immature Granulocytes % Auto: 0.4 % (ref 0.00–0.50)
Immature Granulocytes Abs Auto: 0 10*3/uL (ref 0.0–0.0)
Lymphocytes % Auto: 41.1 % — ABNORMAL HIGH (ref 5.0–41.0)
Lymphocytes Abs Auto: 2.3 10*3/uL (ref 1.3–2.9)
MCH: 32.6 pg (ref 27.0–34.0)
MCHC g/dL: 35.3 g/dL (ref 33.0–37.0)
MCV: 92.4 fL (ref 82.0–97.0)
MPV: 11 fL (ref 9.4–12.4)
Monocytes % Auto: 9.2 % (ref 0.0–10.0)
Monocytes Abs Auto: 0.5 10*3/uL (ref 0.3–0.8)
Neutrophils % Auto: 43.7 % — ABNORMAL LOW (ref 45.0–75.0)
Neutrophils Abs Auto: 2.41 10*3/uL (ref 2.20–4.80)
Nucleated Cell Count: 0 10*3/uL (ref 0.0–0.1)
Nucleated RBC/100 WBC: 0 % WBC (ref <=0.0)
Platelet Count: 242 10*3/uL (ref 151–365)
RDW: 12.8 % (ref 11.5–14.5)
Red Blood Cell Count: 4.08 10*6/uL (ref 3.80–5.10)
White Blood Cell Count: 5.5 10*3/uL (ref 4.2–10.8)

## 2020-02-25 LAB — URINALYSIS-COMPLETE
Bilirubin: NEGATIVE
Glucose: NEGATIVE mg/dL
Ketones: NEGATIVE
Nitrite: NEGATIVE
Occult Blood: NEGATIVE
Protein: 30 mg/dL — AB
RBC: 1 /HPF (ref <=3)
Specific Gravity: 1.019 (ref 1.002–1.030)
Squamous Epithelial Cells: <1 /HPF
Urobilinogen: NEGATIVE mg/dL
WBC: 2 /HPF (ref <=5)
pH Urine: 7 (ref 5.0–9.0)

## 2020-02-25 LAB — LIPID PANEL WITH DLDL REFLEX
Cholesterol: 185 mg/dL (ref 112–200)
HDL Cholesterol: 27 mg/dL — ABNORMAL LOW (ref >=40)
Non-HDL Cholesterol: 158 mg/dL — ABNORMAL HIGH (ref <=150.0)
Total Cholesterol: HDL Ratio: 6.9 mg/dL — ABNORMAL HIGH (ref 2.0–5.0)
Triglyceride: 555 mg/dL — ABNORMAL HIGH (ref 30–150)

## 2020-02-25 LAB — MMC THYROID PANEL
Thyroid Stimulating Hormone: 1.15 u[IU]/mL (ref 0.45–5.33)
Thyroxine, Free (Free T4): 0.7 ng/dL (ref 0.6–1.6)

## 2020-02-26 LAB — VITAMIN D, 25 HYDROXY: Vitamin D, 25 Hydroxy: 14.8 ng/mL — ABNORMAL LOW (ref 30–80)

## 2020-06-22 ENCOUNTER — Ambulatory Visit: Payer: Medicare PPO | Attending: Family Medicine

## 2020-06-22 DIAGNOSIS — R799 Abnormal finding of blood chemistry, unspecified: Secondary | ICD-10-CM | POA: Insufficient documentation

## 2020-06-22 DIAGNOSIS — R252 Cramp and spasm: Secondary | ICD-10-CM | POA: Insufficient documentation

## 2020-06-22 DIAGNOSIS — I10 Essential (primary) hypertension: Secondary | ICD-10-CM | POA: Insufficient documentation

## 2020-06-22 DIAGNOSIS — E7841 Elevated Lipoprotein(a): Secondary | ICD-10-CM | POA: Insufficient documentation

## 2020-06-22 DIAGNOSIS — R7303 Prediabetes: Secondary | ICD-10-CM | POA: Insufficient documentation

## 2020-06-22 LAB — COMPREHENSIVE METABOLIC PANEL
Adjusted Calcium: 9 mg/dL (ref 8.7–10.2)
Alanine Transferase (ALT): 23 U/L (ref 4–56)
Alb/Glob Ratio: 1 (ref 1.0–1.6)
Albumin: 4 g/dL (ref 3.2–4.7)
Alkaline Phosphatase (ALP): 60 U/L (ref 38–126)
Aspartate Transaminase (AST): 22 U/L (ref 9–44)
BUN/ Creatinine: 31.7 — ABNORMAL HIGH (ref 7.3–21.7)
Bilirubin Total: 1.1 mg/dL (ref 0.1–2.2)
Calcium: 9 mg/dL (ref 8.7–10.2)
Carbon Dioxide Total: 26 mmol/L (ref 22–32)
Chloride: 102 mmol/L (ref 99–109)
Creatinine Serum: 0.6 mg/dL (ref 0.50–1.30)
E-GFR, Non-African American: 109 mL/min/{1.73_m2} (ref >60)
E-GFR: 126 mL/min/{1.73_m2} (ref >60)
Globulin: 4.1 g/dL (ref 2.2–4.2)
Glucose: 82 mg/dL (ref 70–99)
Potassium: 3.4 mmol/L — ABNORMAL LOW (ref 3.5–5.2)
Protein: 8.1 g/dL (ref 5.9–8.2)
Sodium: 139 mmol/L (ref 134–143)
Urea Nitrogen, Blood (BUN): 19 mg/dL (ref 6–21)

## 2020-06-22 LAB — CBC WITH DIFFERENTIAL
Basophils % Auto: 0.6 % (ref 0.0–1.0)
Basophils Abs Auto: 0 10*3/uL (ref 0.0–0.1)
Eosinophils % Auto: 1.1 % (ref 0.0–4.0)
Eosinophils Abs Auto: 0.1 10*3/uL (ref 0.0–0.2)
Hematocrit: 39.1 % (ref 36.0–48.0)
Hemoglobin: 13.7 g/dL (ref 12.0–16.0)
Immature Granulocytes % Auto: 0.2 % (ref 0.00–0.50)
Immature Granulocytes Abs Auto: 0 10*3/uL (ref 0.0–0.0)
Lymphocytes % Auto: 50.8 % — ABNORMAL HIGH (ref 5.0–41.0)
Lymphocytes Abs Auto: 2.7 10*3/uL (ref 1.3–2.9)
MCH: 32.8 pg (ref 27.0–34.0)
MCHC g/dL: 35 g/dL (ref 33.0–37.0)
MCV: 93.5 fL (ref 82.0–97.0)
MPV: 11.8 fL (ref 9.4–12.4)
Monocytes % Auto: 4.8 % (ref 0.0–10.0)
Monocytes Abs Auto: 0.3 10*3/uL (ref 0.3–0.8)
Neutrophils % Auto: 42.5 % — ABNORMAL LOW (ref 45.0–75.0)
Neutrophils Abs Auto: 2.29 10*3/uL (ref 2.20–4.80)
Nucleated Cell Count: 0 10*3/uL (ref 0.0–0.1)
Nucleated RBC/100 WBC: 0 % WBC (ref <=0.0)
Platelet Count: 211 10*3/uL (ref 151–365)
RDW: 13 % (ref 11.5–14.5)
Red Blood Cell Count: 4.18 10*6/uL (ref 3.80–5.10)
White Blood Cell Count: 5.4 10*3/uL (ref 4.2–10.8)

## 2020-06-22 LAB — LIPID PANEL
Cholesterol: 202 mg/dL — ABNORMAL HIGH (ref 112–200)
HDL Cholesterol: 38 mg/dL — ABNORMAL LOW (ref >=40)
LDL Cholesterol Calculation: 107 mg/dL — ABNORMAL HIGH (ref <100)
Non-HDL Cholesterol: 164 mg/dL — ABNORMAL HIGH (ref <=150.0)
Total Cholesterol: HDL Ratio: 5.3 mg/dL — ABNORMAL HIGH (ref 2.0–5.0)
Triglyceride: 287 mg/dL — ABNORMAL HIGH (ref 30–150)

## 2020-06-22 LAB — HEMOGLOBIN A1C
Hgb A1C,Glucose Est Avg: 111 mg/dL
Hgb A1C: 5.5 % (ref 4.0–5.6)

## 2020-06-22 LAB — FERRITIN: Ferritin: 76 ng/mL (ref 11–307)

## 2020-10-18 ENCOUNTER — Emergency Department: Payer: Medicare PPO

## 2020-10-18 ENCOUNTER — Emergency Department
Admission: RE | Admit: 2020-10-18 | Discharge: 2020-10-18 | Disposition: A | Discharge status: Home / Self Care | Payer: Medicare PPO | Attending: Emergency Medicine | Admitting: Emergency Medicine

## 2020-10-18 DIAGNOSIS — Y92018 Other place in single-family (private) house as the place of occurrence of the external cause: Secondary | ICD-10-CM | POA: Insufficient documentation

## 2020-10-18 DIAGNOSIS — L608 Other nail disorders: Secondary | ICD-10-CM | POA: Insufficient documentation

## 2020-10-18 DIAGNOSIS — S90111A Contusion of right great toe without damage to nail, initial encounter: Secondary | ICD-10-CM | POA: Insufficient documentation

## 2020-10-18 DIAGNOSIS — W228XXA Striking against or struck by other objects, initial encounter: Secondary | ICD-10-CM | POA: Insufficient documentation

## 2020-10-18 NOTE — Discharge Instructions (Signed)
DC Instructions  Thank you for choosing [facility] for your emergency health care needs. It has been our privilege to take care of you today. Your primary complaints have been evaluated based on your history, lab tests, imaging tests and you have been treated for your symptoms and discharged home. Please take all medicines that are prescribed to you as directed (see below). It is crucial, if you have a primary care physician, to follow up with him or her in the time frame recommended as many health conditions that seem self-limited initially may actually worsen over time. If you do not have a primary care physician, we will outline the various resources available for you to find one.    If at any time you feel that your condition is worsening, call your doctor or return to the Emergency Department for reevaluation. CALL 911 IF YOU THINK YOU ARE HAVING A MEDICAL EMERGENCY. Return to the Emergency Department if you are unable to obtain the recommended follow-up treatment or you are not better as expected. You can call the Medical Center with questions at [phone].    Please realize that the results of some studies that you had done during your stay with [place] (such as x-rays and cultures) have only preliminarily results at this time. Results of these studies may change as more information becomes available or as the studies are re-evaluated by other members of our health care team in the next few days. We will attempt to contact you with any important changes or additions to the studies that were obtained today, particularly if any of these results require a change in your treatment.    Do warm soaks with Epson salt for 30 minutes, elevate the toenail at the edges, do not cut the toenails, follow-up with your primary care doctor, if problems continued need referral to podiatry.  Return to emergency department if worsening or concern.

## 2020-10-18 NOTE — ED Nursing Note (Signed)
Patient discharged in stable condition, in NAD. Patient given discharge instructions verbally and on paper. Pt verbalized the need to follow up w/PCP. Pt denies questions or concerns at this time. Patient verbalized understanding and need to return to ED for any new or worsening S/S such as but not limited to signs of infection with warmth, swelling, pain and redness to foot. Pt encouraged to drink plenty of clear liquids. All belongings sent with patient. Pt left ambulatory.

## 2020-10-18 NOTE — ED Nursing Note (Signed)
Wilhelm, PA at bedside for exam/assessment.

## 2020-10-18 NOTE — ED Triage Note (Signed)
Right great toe pain x 1 week. Callous noted, no redness. Has also been having bilateral cramping to calves.

## 2020-10-18 NOTE — ED Provider Notes (Signed)
Chief Complaint   Patient presents with    Toe Pain     HPI: Lori Bowman is a 57yr female who presents with right great toe pain.  Patient states she kicked an object a few weeks ago, she has been having toe pain on the medial aspect of the right great toe, states that it is tender when walking, she also has pain over the plantar area of the foot, patient comes in for further evaluation. Denies fever, nausea, vomiting, diarrhea, headache, dyspnea, chest pain, abdominal pain, edema.     Quality: Sharp  Location: Right foot and toe  Severity: 2/10  Provoking Factors: Walking  Relieving Factors: Not walking    Past Medical History:   Diagnosis Date    HTN (hypertension)     Hypercholesterolemia        No past surgical history on file.    No family history on file.    Social History     Socioeconomic History    Marital status: MARRIED     Spouse name: Not on file    Number of children: Not on file    Years of education: Not on file    Highest education level: Not on file   Occupational History    Not on file   Tobacco Use    Smoking status: Never Smoker    Smokeless tobacco: Never Used   Substance and Sexual Activity    Alcohol use: Yes    Drug use: Never    Sexual activity: Not on file   Other Topics Concern    Not on file   Social History Narrative    Not on file     Social Determinants of Health     Financial Resource Strain: Not on file   Food Insecurity: Not on file   Transportation Needs: Not on file   Physical Activity: Not on file   Stress: Not on file   Social Connections: Not on file   Intimate Partner Violence: Not on file   Housing Stability: Not on file       Allergies: No Known Allergies    Medication: No current facility-administered medications for this encounter.    Current Outpatient Medications:     Amlodipine (NORVASC) 5 mg Tablet, Take 5 mg by mouth every morning., Disp: , Rfl:     CALCIUM PO, Take 1  tablet by mouth every morning., Disp: , Rfl:     cranberry conc/ascorbic acid (CRANBERRY PLUS VITAMIN C PO), Take 2 tablets by mouth 3 times daily., Disp: , Rfl:     docosahexaenoic acid/epa (FISH OIL PO), Take 1 capsule by mouth every morning., Disp: , Rfl:     Ezetimibe (ZETIA) 10 mg Tablet, Take 10 mg by mouth every morning., Disp: , Rfl:     Hydrochlorothiazide (HYDRO-DIURIL) 25 mg Tablet, Take 25 mg by mouth every morning., Disp: , Rfl:     metoprolol succinate 50 mg ER 24 hr Sprinkle Capsule, Take 50 mg by mouth every morning., Disp: , Rfl:     Phentermine (ADIPEX-P) 37.5 mg Capsule, Take 37.5 mg by mouth every morning before a meal., Disp: , Rfl:     ROS:  Review of Systems   Constitutional: Negative for activity change, chills, diaphoresis, fatigue, fever and unexpected weight change.   HENT: Negative for congestion, ear discharge, ear pain, facial swelling, mouth sores, nosebleeds, postnasal drip, rhinorrhea, sinus pressure, sneezing and sore throat.    Eyes: Negative for pain, discharge, redness, itching  and visual disturbance.   Respiratory: Negative for cough, choking, chest tightness, shortness of breath, wheezing and stridor.    Cardiovascular: Negative for chest pain, palpitations and leg swelling.   Gastrointestinal: Negative for abdominal distention, abdominal pain, blood in stool, constipation, diarrhea, nausea, rectal pain and vomiting.   Endocrine: Negative for polydipsia, polyphagia and polyuria.   Genitourinary: Negative for decreased urine volume, dysuria, enuresis, flank pain, frequency, hematuria and urgency.   Musculoskeletal: Positive for gait problem. Negative for arthralgias, back pain, myalgias and neck pain.   Skin: Negative for color change, pallor, rash and wound.   Allergic/Immunologic: Negative for environmental allergies, food allergies and immunocompromised state.   Neurological: Negative for dizziness,  tremors, syncope, light-headedness, numbness and headaches.   Hematological: Negative for adenopathy. Does not bruise/bleed easily.   Psychiatric/Behavioral: Negative for agitation, confusion, hallucinations, self-injury, sleep disturbance and suicidal ideas. The patient is not nervous/anxious.        PE:  Physical Exam  Constitutional:       Appearance: She is well-developed.   HENT:      Head: Normocephalic and atraumatic.   Eyes:      Pupils: Pupils are equal, round, and reactive to light.   Pulmonary:      Effort: Pulmonary effort is normal.      Breath sounds: Normal breath sounds.   Musculoskeletal:         General: Tenderness present. Normal range of motion.      Cervical back: Normal range of motion.      Right foot: Normal range of motion and normal capillary refill. Tenderness present. No swelling, deformity, bunion, Charcot foot, foot drop, prominent metatarsal heads, laceration, bony tenderness or crepitus. Normal pulse.        Feet:    Skin:     General: Skin is warm and dry.   Neurological:      Mental Status: She is alert and oriented to person, place, and time.   Psychiatric:         Behavior: Behavior normal.         Temp: 36.3 C (97.4 F) (08/01 2122)  Temp src: Temporal (08/01 2122)  Pulse: 80 (08/01 2122)  BP: 149/92 (08/01 2122)  Resp: 16 (08/01 2122)  SpO2: 96 % (08/01 2122)  Height: --  Weight: 65 kg (143 lb 4.8 oz) (08/01 2122)    Differentials: Doubt ingrown nail, contusion, fracture, sprain, strain    Labs: No results found for this visit on 10/18/20.    Radiology:None    Orders Placed This Encounter    [facility] FOOT 3 OR MORE VIEWS RIGHT       MDM  Reviewed: nursing note and vitals  Interpretation: x-ray        ED Summary: Lori Bowman is a 56yr female with right great toe pain. Work up in the ED consisted of pain examination.  Exam reveals some  mild tenderness over the plantar fascia, she also has tenderness over the great toe medial aspect of the nail, I see no indications of an ingrown nail, no redness, no significant swelling, no discharge, there is tenderness to the medial aspect.  Rest exam noted above and otherwise unremarkable, patient had an x-ray of the right foot which reveals no acute findings.  Discussed soaks and Epson salt, discussed elevating the nail make sure its not growing inward, discussed x-ray results, recommend follow-up with primary care with referral podiatry if pain continues. I discussed the results with the patient along  with likely diagnosis. Patient was advised to follow with primary care in 2 days and return to the ED if any concern.    Procedures: None    Clinical Impression:     ICD-10-CM    1. Nail deformity  L60.8    2. Contusion of right great toe without damage to nail, initial encounter  S90.111A        PLAN: 35yr female findings along with testing likely consistent with nail deformity, and contusion of right great toe. Follow up and return precautions discussed with the patient as above. The patient participated in the decision making process, verbalized understanding, and no barriers to learning identified.    The following medications were prescribed: None    Disposition: Discharge. Follow up with PCP. ED discharge instructions were reviewed and provided.    APP DOCUMENTATION:  I have evaluated this patient, Dr. Volney Presser was available for consultation.    PATIENT'S GENERAL CONDITION:  Good: Vital signs are stable and within normal limits. Patient is conscious and comfortable. Indicators are excellent.     Though I suspect no acutely life threatening condition, I did recommend patient return for any new or worse problems. PT was advised to follow up with PCP for further evaluation as soon as reasonable. Patient is aware their evaluation was not complete and primarily acute issues were evaluated and there is still a  possibility of chronic issues requiring further evaluation.     Comment: Please note this record has been produced using speech recognition system and may contain errors related to that system including errors in grammar, punctuation, and spelling, as well as words and phrases that may be inappropriate. If there are any questions or concerns please feel free to contact the dictating provider for clarification.    Electronically Signed by: Dennie Fetters, PA-C

## 2021-01-24 ENCOUNTER — Ambulatory Visit (HOSPITAL_BASED_OUTPATIENT_CLINIC_OR_DEPARTMENT_OTHER): Payer: Medicare PPO

## 2021-01-24 ENCOUNTER — Encounter (HOSPITAL_BASED_OUTPATIENT_CLINIC_OR_DEPARTMENT_OTHER): Payer: Self-pay

## 2021-03-09 ENCOUNTER — Ambulatory Visit (HOSPITAL_BASED_OUTPATIENT_CLINIC_OR_DEPARTMENT_OTHER): Payer: Medicare PPO | Admitting: Family Medicine

## 2021-04-07 ENCOUNTER — Ambulatory Visit (HOSPITAL_BASED_OUTPATIENT_CLINIC_OR_DEPARTMENT_OTHER): Payer: Medicare PPO | Admitting: Family Medicine

## 2021-07-11 ENCOUNTER — Encounter (HOSPITAL_BASED_OUTPATIENT_CLINIC_OR_DEPARTMENT_OTHER): Payer: Self-pay | Admitting: Family Medicine

## 2021-07-11 ENCOUNTER — Ambulatory Visit (HOSPITAL_BASED_OUTPATIENT_CLINIC_OR_DEPARTMENT_OTHER): Payer: Medicare PPO | Admitting: Family Medicine

## 2021-07-11 VITALS — BP 120/78 | HR 101 | Temp 96.9°F | Ht 64.0 in | Wt 148.3 lb

## 2021-07-11 DIAGNOSIS — E781 Pure hyperglyceridemia: Secondary | ICD-10-CM

## 2021-07-11 DIAGNOSIS — R053 Chronic cough: Secondary | ICD-10-CM

## 2021-07-11 DIAGNOSIS — H9190 Unspecified hearing loss, unspecified ear: Secondary | ICD-10-CM

## 2021-07-11 DIAGNOSIS — E663 Overweight: Secondary | ICD-10-CM

## 2021-07-11 DIAGNOSIS — I10 Essential (primary) hypertension: Secondary | ICD-10-CM

## 2021-07-11 DIAGNOSIS — E119 Type 2 diabetes mellitus without complications: Secondary | ICD-10-CM

## 2021-07-11 MED ORDER — LISINOPRIL 10 MG TABLET: 10.0000 mg | ORAL_TABLET | Freq: Every day | ORAL | 3 refills | Status: DC

## 2021-07-11 MED ORDER — AMLODIPINE 5 MG TABLET
5.00 mg | ORAL_TABLET | Freq: Every day | ORAL | 3 refills | Status: AC
Start: 2021-07-11 — End: 2022-07-06

## 2021-07-11 MED ORDER — PHENTERMINE 37.5 MG CAPSULE
37.50 mg | ORAL_CAPSULE | Freq: Every day | ORAL | 0 refills | Status: DC
Start: 2021-07-11 — End: 2021-10-18

## 2021-07-11 MED ORDER — METFORMIN 500 MG TABLET: 500.0000 mg | ORAL_TABLET | Freq: Every day | ORAL | 3 refills | Status: DC

## 2021-07-11 MED ORDER — ATORVASTATIN 10 MG TABLET: 10.0000 mg | ORAL_TABLET | Freq: Every day | ORAL | 3 refills | Status: DC

## 2021-07-11 NOTE — Progress Notes (Signed)
66 yo female, new pt, presents to establish care and for follow up of chronic medical conditions. Pt states she has had a cough and sore throat x 2 months. She wonders if it is allergy related. Cough is wet and she is constantly clearing her throat. Denies any heart burn. Occasional sneezing. Denies any SOB with exercise. She also complains of bilateral leg cramps. It started when she gained weight. Her previous doctor prescribed phentermine. She was able to loose weight and this helped the craming. She tolerates phentermine well. Denies any palpitations. Pt complains of hearing loss bilaterally. She misses a lot during a conversation. Pt has not had a hearing test for 20 years. She has Diabetes II and started metformin one year ago. She is tolerating it well. No diarrhea. Denies any numbness or tingling in hands or feet. No vision concerns.         Current Medications:  Current Outpatient Medications   Medication Sig Dispense Refill    Amlodipine (NORVASC) 5 mg Tablet Take 1 tablet by mouth every morning. 90 tablet 3    Atorvastatin (LIPITOR) 10 mg Tablet Take 1 tablet by mouth every day. 90 tablet 3    CALCIUM PO Take 1 tablet by mouth every morning.      cranberry conc/ascorbic acid (CRANBERRY PLUS VITAMIN C PO) Take 2 tablets by mouth 3 times daily.      docosahexaenoic acid/epa (FISH OIL PO) Take 1 capsule by mouth every morning.      Ezetimibe (ZETIA) 10 mg Tablet Take 1 tablet by mouth every morning.      Hydrochlorothiazide (HYDRO-DIURIL) 25 mg Tablet Take 1 tablet by mouth every morning.      Lisinopril (PRINIVIL, ZESTRIL) 10 mg Tablet Take 1 tablet by mouth every day. 90 tablet 3    Metformin (GLUCOPHAGE) 500 mg Tablet Take 1 tablet by mouth once daily with a meal. 90 tablet 3    metoprolol succinate 50 mg ER 24 hr Sprinkle Capsule Take 1 capsule by mouth every morning.      Phentermine (ADIPEX-P) 37.5 mg Capsule Take 1 capsule by mouth every morning before a meal. 90 capsule 0     No current  facility-administered medications for this visit.       ALLERGIES:  Patient has no known allergies.    Family History:  family history includes Diabetes in her mother; Hypertension in her mother.    Social History:   Social History     Tobacco Use    Smoking status: Never    Smokeless tobacco: Never   Vaping Use    Vaping Use: Never used   Substance Use Topics    Alcohol use: Never    Drug use: Never       Surgical History:   Past Surgical History:   Procedure Laterality Date    CESAREAN SECTION  1982    CESAREAN SECTION  1987    COLONOSCOPY  2015    Dr.JYOTHI Collene Mares - [place]    COLONOSCOPY  2022    Dr. Laqueta Due - [place]       ROS:  14 point ROS negative except as mentioned in HPI    Physical Examination:  BP 120/78 (SITE: left arm, Orthostatic Position: sitting, Cuff Size: regular)   Pulse 101   Temp 36.1 C (96.9 F) (Temporal)   Ht 1.626 m (5\' 4" )   Wt 67.3 kg (148 lb 4.8 oz)   SpO2 97%   BMI 25.46 kg/m?  Body  mass index is 25.46 kg/m?Marland Kitchen    Gen'l: Alert, NAD  HEENT: PERRLA, EOM intact, no scleral icterus, no conjunctival injection. EACs clear, TMs pearly and flat. Nares clear. Pharynx pink, no lesions. Normal dentition. Mucous membranes moist.  Neck: Supple, no lymphadenopathy. No thyromegaly or nodules.   CV: RRR, no murmurs  Resp: CTA bilaterally  Abd: Soft, non-tender, non-distended, no masses, no hepatosplenomegaly, BS+  Skin: Warm, moist, no rashes  Ext: No clubbing, cyanosis or edema  Peripheral Pulses: 2+  Back: Non-tender, normal ROM  Neuro: Oriented x 3; CN II-XII intact  Musculoskeletal: Normal ROM in all joints, no swelling or deformity  Psych: Appropriate mood and affect    Labs:   See [facility] labs scanned in      Impression and Plan:    1. Controlled type 2 diabetes mellitus without complication, without long-term current use of insulin (HCC)  Well controlled; continue current medication   - CBC with Differential; Future  - Comprehensive Metabolic Panel; Future  - Hemoglobin A1C;  Future  - Lipid Panel; Future    2. Primary hypertension  Well controlled; continue current medication     3. Hypertriglyceridemia  Continue current medication    4. Overweight (BMI 25.0-29.9)  CURES reviewed  - Phentermine (ADIPEX-P) 37.5 mg Capsule; Take 1 capsule by mouth every morning before a meal.  Dispense: 90 capsule; Refill: 0    5. Hearing loss, unspecified hearing loss type, unspecified laterality  - Audiology Referral [facility])    6. Chronic cough  Pt requesting Endoscopy  - Gastroenterology Referral

## 2021-07-12 ENCOUNTER — Encounter (HOSPITAL_BASED_OUTPATIENT_CLINIC_OR_DEPARTMENT_OTHER): Payer: Self-pay | Admitting: Family Medicine

## 2021-08-19 ENCOUNTER — Ambulatory Visit (HOSPITAL_BASED_OUTPATIENT_CLINIC_OR_DEPARTMENT_OTHER): Payer: Medicare PPO | Admitting: Gastroenterology

## 2021-10-10 ENCOUNTER — Other Ambulatory Visit (HOSPITAL_BASED_OUTPATIENT_CLINIC_OR_DEPARTMENT_OTHER): Payer: Self-pay | Admitting: Family Medicine

## 2021-10-10 DIAGNOSIS — E663 Overweight: Secondary | ICD-10-CM

## 2021-10-11 NOTE — Telephone Encounter (Signed)
Please verify correct pharmacy  Please ensure 90 day supply for mail order pharmacies.  Please verify prescription days equals quantity x refills  BP Readings from Last 1 Encounters:   07/11/21 120/78      Pulse Readings from Last 1 Encounters:   07/11/21 101     Lab Results   Component Value Date    WBC 5.4 06/22/2020    HGB 13.7 06/22/2020    PLT 211 06/22/2020    NA 139 06/22/2020    K 3.4 (L) 06/22/2020    GLU 82 06/22/2020    CR 0.60 06/22/2020    ALT 23 06/22/2020     Recent Visits  Date Type Provider Dept   07/11/21 Office Visit Billett, Rexene Agent[facility]   Showing recent visits within past 365 days with a meds authorizing provider and meeting all other requirements  Future Appointments  Date Type Provider Dept   10/24/21 Appointment Billett, Rexene Agent[facility]   Showing future appointments within next 180 days with a meds authorizing provider and meeting all other requirements        [facility]           User Date Status   Vernia Buff 07/11/2021 12:55 PM Reviewed PDMP [1]               Current Medications  has a current medication list which includes the following prescription(s): amlodipine, atorvastatin, calcium, cranberry conc/ascorbic acid, docosahexaenoic acid/epa, ezetimibe, hydrochlorothiazide, lisinopril, metformin, metoprolol succinate, and phentermine.

## 2021-10-19 ENCOUNTER — Ambulatory Visit: Payer: Medicare PPO

## 2021-10-19 ENCOUNTER — Ambulatory Visit (HOSPITAL_BASED_OUTPATIENT_CLINIC_OR_DEPARTMENT_OTHER): Payer: Medicare PPO | Admitting: Nurse Practitioner

## 2021-10-19 ENCOUNTER — Ambulatory Visit: Payer: Medicare PPO | Attending: Internal Medicine | Admitting: Nurse Practitioner

## 2021-10-19 VITALS — BP 100/60 | HR 105 | Temp 97.2°F | Resp 19 | Wt 143.1 lb

## 2021-10-19 DIAGNOSIS — N390 Urinary tract infection, site not specified: Secondary | ICD-10-CM | POA: Insufficient documentation

## 2021-10-19 LAB — POC URINALYSIS-DIPSTICK
POC BILIRUBIN URINE: NEGATIVE
POC GLUCOSE URINE: NEGATIVE mg/dL
POC KETONES: NEGATIVE
POC NITRITE URINE: NEGATIVE
POC PH URINALYSIS: 6 pH (ref 4.8–7.8)
POC SP GRAVITY: 1.03 (ref 1.002–1.030)
POC UROBILINOGEN: 0.2 EU/dL

## 2021-10-19 MED ORDER — CEPHALEXIN 500 MG CAPSULE
500.00 mg | ORAL_CAPSULE | Freq: Three times a day (TID) | ORAL | 0 refills | Status: DC
Start: 2021-10-19 — End: 2021-10-24

## 2021-10-19 NOTE — Progress Notes (Signed)
"You have access to this medical note to help you improve your understanding of what was discussed during the office visit.  If you have questions about the meaning or medical terminology being used, please bring it up at your next follow up appointment or send [place] a MyChart message with your questions. Medical notes are meant to be a communication tool between medical professionals and require medical terminology to be used for efficiency".      CC:  Urinary Tract Infection (Burning and frequency x 1 day )      SUBJECTIVE:   Lori Bowman is a 66 year-old female here with complaints of dysuria and urinary frequency.  The patient states her symptoms started about midnight last night and worsened throughout the night.  The patient denies any fevers, but is started to feel a little bit chilled.  The patient also denies any gross hematuria, flank pain, nausea, vomiting, or diarrhea.      REVIEW OF SYSTEMS:  General/Constitutional:   General appetite okay; patient denies any fevers, chills, night sweats, or weakness  Respiratory:   Patient denies cough, resting shortness of breath, chest congestion  Cardiovascular:   Patient denies chest pain, palpitations, dyspnea on exertion, leg edema  Gastrointestinal:   Patient denies any nausea, vomiting, diarrhea       MEDICATIONS:  Outpatient Medications Marked as Taking for the 10/19/21 encounter (Office Visit) with Nalee Lightle, Darryll Capers, NP   Medication Sig Dispense Refill    Amlodipine (NORVASC) 5 mg Tablet Take 1 tablet by mouth every morning. 90 tablet 3    Atorvastatin (LIPITOR) 10 mg Tablet Take 1 tablet by mouth every day. 90 tablet 3    CALCIUM PO Take 1 tablet by mouth every morning.      cranberry conc/ascorbic acid (CRANBERRY PLUS VITAMIN C PO) Take 2 tablets by mouth 3 times daily.      docosahexaenoic acid/epa (FISH OIL PO) Take 1 capsule by mouth every morning.      Ezetimibe (ZETIA) 10 mg Tablet Take 1 tablet by mouth every morning.      Hydrochlorothiazide  (HYDRO-DIURIL) 25 mg Tablet Take 1 tablet by mouth every morning.      Lisinopril (PRINIVIL, ZESTRIL) 10 mg Tablet Take 1 tablet by mouth every day. 90 tablet 3    Metformin (GLUCOPHAGE) 500 mg Tablet Take 1 tablet by mouth once daily with a meal. 90 tablet 3    metoprolol succinate 50 mg ER 24 hr Sprinkle Capsule Take 1 capsule by mouth every morning.      Phentermine (ADIPEX-P) 37.5 mg Capsule Take 1 capsule by mouth every morning before a meal. 30 capsule 0   ?    ALLERGIES:  No Known Allergies    PAST MEDICAL HISTORY:  Past Medical History:   Diagnosis Date    Diabetes (HCC)     HTN (hypertension)     Hx of cystitis     Hx: UTI (urinary tract infection)     Hypercholesterolemia        PAST SURGICAL HISTORY:  Past Surgical History:   Procedure Laterality Date    CESAREAN SECTION  1982    CESAREAN SECTION  1987    COLONOSCOPY  2015    Dr.JYOTHI Jeanine Luz [place]    COLONOSCOPY  2022    Dr. Waynetta [facility]       SOCIAL HISTORY:  Social History     Tobacco Use   Smoking Status Never   Smokeless Tobacco  Never     Social History     Substance and Sexual Activity   Alcohol Use Never     Social History     Substance and Sexual Activity   Drug Use Never          OBJECTIVE:    VITAL SIGNS:  BP 100/60 (SITE: right arm, Orthostatic Position: sitting, Cuff Size: regular)   Pulse 105   Temp 36.2 C (97.2 F)   Resp 19   Wt 64.9 kg (143 lb 1.6 oz)   SpO2 91%   BMI 24.56 kg/m     GENERAL:  Pleasant, cooperative 66 year-old female in no acute distress; awake, alert; ambulating without any devices  HEENT: normocephalic, atraumatic  ABDOMEN:  abdomen soft, non-distended without any palpable masses; mild tenderness to palpation of suprapubic region; no tenderness to percussion of flank/CVA area  EXTREMITIES: no cyanosis or edema  SKIN: warm and dry  NEURO:  alert and oriented x3; appropriate for age  [facility]:  appropriate mood and affect; good eye contact; good judgement and insight      Laboratory / Imaging     POC  Urine (Dipstick)    Lab Results   Lab Name Value Date/Time    [facility] Cloudy 10/19/2021 03:55 PM    COLORURPOC Straw Colored 10/19/2021 03:55 PM    GLUCOSEURPOC Negative 10/19/2021 03:55 PM    BILIRUBURPOC Negative 10/19/2021 03:55 PM    KETONESURPOC Negative 10/19/2021 03:55 PM    SPGRAVURPOC 1.030 10/19/2021 03:55 PM    BLOODURPOC Moderate (++) (Abnl) 10/19/2021 03:55 PM    PHURPOC 6.0 10/19/2021 03:55 PM    PROTEINURPOC Trace (Abnl) 10/19/2021 03:55 PM    UROBILIURPOC 0.2 10/19/2021 03:55 PM    NITRITEURPOC Negative 10/19/2021 03:55 PM    LEUKESTURPOC Small (+) (Abnl) 10/19/2021 03:55 PM    READERURPOC ds 10/19/2021 03:55 PM                 Reviewed by me and abnormalities addressed      Assessment and Plan     1. Urinary tract infection without hematuria, site unspecified  -- POC URINALYSIS-DIPSTICK  -- Cephalexin (KEFLEX) 500 mg Capsule; Take 1 capsule by mouth 3 times daily for 7 days.  Dispense: 21 capsule; Refill: 0  -- Culture Urine, Bacti    -- Return as scheduled, or sooner as needed.      The patient was given the opportunity to ask questions throughout the visit.  These were answered to the patient's stated satisfaction and they are agreeable to the plan of care.      Progress notes authenticated by Rudi Rummage, NP 10/19/2021 4:02 PM      -- Portions of this note were prepared with Nuance Dragon voice recognition software. Occasional phonetic and grammatical errors may have escaped proofreading.

## 2021-10-20 LAB — CULTURE URINE, BACTI

## 2021-10-20 NOTE — Progress Notes (Signed)
Urine C&S showing negative for UTI.

## 2021-10-24 ENCOUNTER — Ambulatory Visit (HOSPITAL_BASED_OUTPATIENT_CLINIC_OR_DEPARTMENT_OTHER): Payer: Medicare PPO | Admitting: Family Medicine

## 2021-10-24 ENCOUNTER — Encounter (HOSPITAL_BASED_OUTPATIENT_CLINIC_OR_DEPARTMENT_OTHER): Payer: Self-pay | Admitting: Family Medicine

## 2021-10-24 VITALS — BP 108/76 | HR 97 | Temp 97.2°F | Ht 64.0 in | Wt 145.0 lb

## 2021-10-24 DIAGNOSIS — I10 Essential (primary) hypertension: Secondary | ICD-10-CM

## 2021-10-24 DIAGNOSIS — E119 Type 2 diabetes mellitus without complications: Secondary | ICD-10-CM

## 2021-10-24 DIAGNOSIS — E663 Overweight: Secondary | ICD-10-CM

## 2021-10-24 DIAGNOSIS — J309 Allergic rhinitis, unspecified: Secondary | ICD-10-CM

## 2021-10-24 DIAGNOSIS — G4762 Sleep related leg cramps: Secondary | ICD-10-CM

## 2021-10-24 DIAGNOSIS — R0683 Snoring: Secondary | ICD-10-CM

## 2021-10-24 DIAGNOSIS — E781 Pure hyperglyceridemia: Secondary | ICD-10-CM

## 2021-10-24 MED ORDER — PHENTERMINE 37.5 MG CAPSULE
37.50 mg | ORAL_CAPSULE | Freq: Every day | ORAL | 0 refills | Status: AC
Start: 2021-10-24 — End: 2022-10-19

## 2021-10-24 MED ORDER — FLUTICASONE PROPIONATE 50 MCG/ACTUATION NASAL SPRAY,SUSPENSION
1.00 | Freq: Every day | NASAL | 3 refills | Status: AC
Start: 2021-10-24 — End: 2022-10-19

## 2021-10-24 MED ORDER — CETIRIZINE 10 MG TABLET
10.00 mg | ORAL_TABLET | Freq: Every day | ORAL | 11 refills | Status: AC
Start: 2021-10-24 — End: 2022-10-19

## 2021-10-24 NOTE — Progress Notes (Signed)
66 yo female presents for follow up of chronic medical conditions and several other concerns. She did not complete blood work prior to her appointment today. Pt feels Phentermine is helping with appetite reduction. She lost weight but went with out medication for one week and gained some back. Her UTI symptoms are improving. Yesterday she working out side and felt really nauseous and dizzy. She is not sure if it was from the heat or a side effect from Keflex. She complains of leg cramping at night x several years.  No issues during the day. She walks daily for exercise. BP has been low the past few office recordings. Pts husband has been complaining of her snoring x 6 months. Pt admits her allergies have been bad. Her nose is always congested and her tonsils feel swollen. She does not like to take allergy medication as it causes her to feel sleepy. She has not tried a nasal spray. She wakes up feeling well rested. Denies any daytime hypersomnolence.    Physical Examination:  BP 108/76 (SITE: left arm, Orthostatic Position: sitting, Cuff Size: regular)   Pulse 97   Temp 36.2 C (97.2 F) (Temporal)   Ht 1.626 m (5\' 4" )   Wt 65.8 kg (145 lb)   SpO2 94%   BMI 24.89 kg/m  Body mass index is 24.89 kg/m.    General Appearance: Alert, NAD  HEENT: no scleral icterus, nares with boggy turbinates; pharynx pink; tonsils normal in appearance; mucous membranes moist  Neck: Supple, no lymphadenopathy  Heart: RRR, no murmurs  Lungs: Clear to ausculation bilaterally. No wheezing, rhonchi, rales.  Abdomen: Soft, non-tender, non-distended, BS+  Extremities: No edema  Peripheral Pulses: 2+ pedal pulses  Psych: Appropriate mood and affect    Labs:   Recent Results (from the past 1008 hour(s))   POC URINALYSIS-DIPSTICK    Collection Time: 10/19/21  3:55 PM   Result Value Ref Range    POC COLLECTION METHOD      POC CLARITY Cloudy     POC COLOR Straw Colored     POC GLUCOSE URINE Negative Negative mg/dL    POC BILIRUBIN URINE  Negative Negative    POC KETONES Negative Negative    POC SP GRAVITY 1.030 1.002 - 1.030    POC OCCULT BLOOD URINE Moderate (++) (Abnl) Negative    POC PH URINALYSIS 6.0 4.8 - 7.8 pH    POC PROTEIN URINE Trace (Abnl) Negative    POC UROBILINOGEN 0.2 0.2, 1.0 EU/dL    POC NITRITE URINE Negative Negative    POC LEUK. ESTERASE Small (+) (Abnl) Negative    POC UA READER ID ds    Culture Urine, Bacti    Collection Time: 10/19/21  4:06 PM    Specimen: CLEAN CATCH; URINE   Result Value Ref Range    Urine Culture Moderate mixed urogenital flora            Impression and Plan:    1. Snoring  No symptoms of sleep apnea  Will start allergy medication as she has swollen turbinates and see if symptoms improve    2. Nocturnal leg cramps  D/C HCTZ  Check magnesium level  Encouraged to stay well hydrated  - Magnesium (Mg); Future    3. Primary hypertension  Well controlled to low recently  Will discontinue HCTZ given leg cramps    4. Controlled type 2 diabetes mellitus without complication, without long-term current use of insulin (HCC)  Due for labs  - CBC with  Differential; Future  - Comprehensive Metabolic Panel; Future  - Lipid Panel; Future  - Hemoglobin A1C; Future    5. Hypertriglyceridemia  Due for labs    6. Allergic rhinitis, unspecified seasonality, unspecified trigger  Start treatment to see if snoring improves  - Cetirizine (ZYRTEC) 10 mg Tablet; Take 1 tablet by mouth every day.  Dispense: 30 tablet; Refill: 11  - Fluticasone (FLONASE) 50 mcg/actuation nasal spray; Instill 1 spray into EACH nostril every day.  Dispense: 16 g; Refill: 3    7. Hypomagnesemia  - Magnesium (Mg); Future    Okay to Discontinue keflex as UCx was negative

## 2022-01-30 ENCOUNTER — Encounter (HOSPITAL_BASED_OUTPATIENT_CLINIC_OR_DEPARTMENT_OTHER): Payer: Medicare PPO | Admitting: Family Medicine

## 2023-08-08 MED ORDER — OXYBUTYNIN CHLORIDE ER 5 MG TABLET,EXTENDED RELEASE 24 HR: 5.0000 mg | EXTENDED_RELEASE_CAPSULE | Freq: Every day | ORAL | 11 refills | Status: AC

## 2023-12-31 MED ORDER — BACLOFEN 5 MG TABLET: 5.0000 mg | ORAL_TABLET | Freq: Three times a day (TID) | ORAL | 0 refills | Status: DC | PRN

## 2024-01-14 NOTE — Progress Notes (Addendum)
 Patient cancelled her next appointment and did not make any further appointments  Discharge      Physical Therapy Treatment Note: 01/15/2024    Diagnosis:       ICD-10-CM     1. Low back pain  M54.50         2. 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   Treatment will include manual therapy, therapeutic exercise, postural education, training for proper body mechanics, modalities PRN, and instruction in a home exercise program.     GOALS:   Patient will be able to bend using good body mechanics without increased symptoms  Patient will be able to do Zumba class without increased symptoms  Patient will be able to turn the steering wheel without increased symptoms  Patient will be able to lift heavy dishes without shoulder pain  Patient will be independent with home exercise program and home recommendations             Start time: 9:15        TOTAL TIME: 30 min     Total # of visits: 3/10     Subjective: Patient reports low back is ok, she hasn't been able to do her HEP due to doctors appt.   Right shoulder pain laterally.     What concerns you the most today: increase core strengthening       Objective: Patient seen for the following treatment:  IMPRESSION: LB    1. 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 If there is clinical concern that this may be acute, then further evaluation with MRI may be helpful to better date the fracture for age.  2. Levoconvex scoliosis apex at T12.  3. Mild degenerative disc disease and facet hypertrophy     IMPRESSION: x-ray right shoulder  1. No evidence of acute bone abnormality  2. Perhaps some mild AC joint arthrosis           THEREX 15 min to increase strength  Access Code: [email or web address]  URL:  [email or web address]  Date: 01/08/2024  Prepared by: Mliss Barter     Exercises  - Standing Lumbar Extension   - Supine Single Knee to Chest Stretch  - 1 x daily - 7 x weekly - 2 reps - 30 sec hold  - Supine Piriformis Stretch with Foot on Ground  - 7 reps - 30 sec hold  - Supine Bridge  - 1 x daily - 7 x weekly - 2 sets - 10 reps  -cat/cow to childs pose   - Scapular Retraction with Resistance  - 1 x daily - 3 x weekly - 2 sets - 10 reps  - Shoulder External Rotation and Scapular Retraction with Resistance  - 1 x daily - 3 x weekly - 2 sets - 10 reps  - Scapular Retraction with Resistance Advanced  - 1 x daily - 3 x weekly - 2 sets - 10 reps  ADDED: Dying bug with TA   A & L lift  hold 5 sedc  x 10 reps  Self care management  Use lidocaine patch after icing in the morning  Ice to low back and top of shoulder     Patient was educated in the anatomy and physiology involved.  Common symptoms, treatments, responses to treatment, and self management were discussed with the patient.         MANUAL 15 min to improve mobility   MFR right shoulder RTC tendons  Prone massage tool level 3  posterior shoulder and LB paraspinals          Response to treatment today/ Observation: Patient needs more core and RTC strengthening . We added dying bug today to work core and right shoulder.          Plan: Continue physical therapy for completion of goals stated above . Continue with core and shoulder strengthening.       Mliss Barter, Licensed PT Assistant

## 2024-02-06 MED ORDER — OZEMPIC 0.25 MG OR 0.5 MG (2 MG/3 ML) SUBCUTANEOUS PEN INJECTOR: 0.2500 mg | PEN_INJECTOR | SUBCUTANEOUS | 0 refills | Status: DC

## 2024-02-06 MED ORDER — CLOTRIMAZOLE-BETAMETHASONE 1 %-0.05 % TOPICAL CREAM: TOPICAL_CREAM | Freq: Two times a day (BID) | TOPICAL | 1 refills | Status: AC

## 2024-02-06 MED ORDER — BACLOFEN 5 MG TABLET: 5.0000 mg | ORAL_TABLET | Freq: Three times a day (TID) | ORAL | 11 refills | Status: AC | PRN

## 2024-03-11 MED ORDER — OZEMPIC 0.25 MG OR 0.5 MG (2 MG/3 ML) SUBCUTANEOUS PEN INJECTOR: 0.2500 mg | PEN_INJECTOR | SUBCUTANEOUS | 3 refills | Status: AC

## 2024-03-24 MED ORDER — DOCUSATE SODIUM 100 MG CAPSULE: 100.0000 mg | ORAL_CAPSULE | Freq: Two times a day (BID) | ORAL | 11 refills | Status: AC

## 2024-03-27 ENCOUNTER — Ambulatory Visit
Admission: RE | Admit: 2024-03-27 | Discharge: 2024-03-27 | Disposition: A | Discharge status: Home / Self Care | Source: Ambulatory Visit | Attending: Family Medicine | Admitting: Family Medicine

## 2024-04-01 ENCOUNTER — Ambulatory Visit: Payer: Self-pay | Admitting: Family Medicine

## 2024-04-01 DIAGNOSIS — R9230 Dense breasts, unspecified: Secondary | ICD-10-CM

## 2024-04-02 NOTE — Telephone Encounter (Signed)
 Pt is still deciding on new pcp, will call back to schedule

## 2024-04-03 ENCOUNTER — Other Ambulatory Visit (HOSPITAL_BASED_OUTPATIENT_CLINIC_OR_DEPARTMENT_OTHER): Payer: Self-pay | Admitting: Internal Medicine

## 2024-04-03 DIAGNOSIS — R9230 Dense breasts, unspecified: Secondary | ICD-10-CM

## 2024-04-10 ENCOUNTER — Other Ambulatory Visit (HOSPITAL_BASED_OUTPATIENT_CLINIC_OR_DEPARTMENT_OTHER): Payer: Self-pay | Admitting: Family Medicine

## 2024-04-10 DIAGNOSIS — E119 Type 2 diabetes mellitus without complications: Secondary | ICD-10-CM

## 2024-04-10 DIAGNOSIS — I10 Essential (primary) hypertension: Secondary | ICD-10-CM

## 2024-04-10 DIAGNOSIS — E781 Pure hyperglyceridemia: Secondary | ICD-10-CM

## 2024-04-10 MED ORDER — METFORMIN 500 MG TABLET: 500.0000 mg | ORAL_TABLET | Freq: Every day | ORAL | 3 refills | Status: AC

## 2024-04-10 MED ORDER — ATORVASTATIN 10 MG TABLET: 10.0000 mg | ORAL_TABLET | Freq: Every day | ORAL | 3 refills | Status: AC

## 2024-04-10 MED ORDER — LISINOPRIL 10 MG TABLET: 10.0000 mg | ORAL_TABLET | Freq: Every day | ORAL | 3 refills | Status: AC

## 2024-04-10 NOTE — Telephone Encounter (Signed)
 Refill Guidance:  Typical Refill: 90 days / 3 refills  Schedule appointment before sending request if last visit over 6 months ago, or if more than 1 month past due per last progress notes  Please verify correct pharmacy  Please ensure 90 day supply for mail order pharmacies.  Please verify prescription days equals quantity x refills  BP Readings from Last 3 Encounters:   02/13/24 120/60   02/06/24 128/74   12/31/23 126/68     Lab Results   Component Value Date    CR 0.75 01/01/2024    LDLC 47 01/01/2024      Recent Visits  Date Type Provider Dept   03/21/24 Telephone Elspeth, Gene Satchel, [facility]   02/20/24 Telephone Billett, Gene Satchel[facility]   02/13/24 Office Visit Maureen Pfeiffer, NP [facility]   02/06/24 Office Visit Elspeth, Gene Satchel[facility]   12/31/23 Office Visit Camisa, Damien Jansky, [facility]   12/31/23 Telephone Billett, Gene Satchel[facility]   12/11/23 Telephone Billett, Gene Satchel[facility]   10/16/23 Telephone Billett, Gene Satchel[facility]   10/12/23 Office Visit Ang, Rogue Pallas, [facility]   10/10/23 Telephone Billett, Courtnie Genese, [facility]   Showing recent visits within past 365 days and meeting all other requirements  Today's Visits  Date Type Provider Dept   04/10/24 Telephone Billett, Gene Satchel[facility]   Showing today's visits and meeting all other requirements  Future Appointments  No visits were found meeting these conditions.  Showing future appointments within next 180 days and meeting all other requirements     BP Medications in Epic (for confirmation)    Hypertensive Medication              Lisinopril  (PRINIVIL , ZESTRIL ) 10 mg Tablet Take 1 tablet by mouth every day.

## 2024-05-20 ENCOUNTER — Ambulatory Visit
